# Patient Record
Sex: Female | Born: 1950
Health system: Southern US, Community
[De-identification: ages and names within clinical notes are randomized; demographics above are authoritative.]

## PROBLEM LIST (undated history)

## (undated) DIAGNOSIS — M1990 Unspecified osteoarthritis, unspecified site: Secondary | ICD-10-CM

## (undated) DIAGNOSIS — IMO0001 Reserved for inherently not codable concepts without codable children: Secondary | ICD-10-CM

## (undated) DIAGNOSIS — H269 Unspecified cataract: Secondary | ICD-10-CM

## (undated) DIAGNOSIS — E785 Hyperlipidemia, unspecified: Secondary | ICD-10-CM

## (undated) HISTORY — DX: Reserved for inherently not codable concepts without codable children: IMO0001

## (undated) HISTORY — DX: Unspecified osteoarthritis, unspecified site: M19.90

## (undated) HISTORY — DX: Unspecified cataract: H26.9

## (undated) HISTORY — DX: Hyperlipidemia, unspecified: E78.5

---

## 2001-09-16 ENCOUNTER — Ambulatory Visit (HOSPITAL_COMMUNITY): Admission: RE | Admit: 2001-09-16 | Discharge: 2001-09-16 | Payer: Self-pay | Admitting: Family Medicine

## 2001-09-16 ENCOUNTER — Encounter: Payer: Self-pay | Admitting: Family Medicine

## 2007-10-04 ENCOUNTER — Ambulatory Visit (HOSPITAL_COMMUNITY): Admission: RE | Admit: 2007-10-04 | Discharge: 2007-10-04 | Payer: Self-pay | Admitting: Family Medicine

## 2010-03-10 ENCOUNTER — Ambulatory Visit (HOSPITAL_COMMUNITY): Admission: RE | Admit: 2010-03-10 | Discharge: 2010-03-10 | Payer: Self-pay | Admitting: Preventative Medicine

## 2010-06-27 ENCOUNTER — Ambulatory Visit (HOSPITAL_COMMUNITY): Admission: RE | Admit: 2010-06-27 | Discharge: 2010-06-27 | Payer: Self-pay | Admitting: Family Medicine

## 2010-07-12 ENCOUNTER — Encounter: Payer: Self-pay | Admitting: Gastroenterology

## 2010-07-19 ENCOUNTER — Ambulatory Visit: Payer: Self-pay | Admitting: Gastroenterology

## 2010-07-19 ENCOUNTER — Ambulatory Visit (HOSPITAL_COMMUNITY): Admission: RE | Admit: 2010-07-19 | Discharge: 2010-07-19 | Payer: Self-pay | Admitting: Gastroenterology

## 2010-11-08 NOTE — Letter (Signed)
Summary: Internal Other Domingo Dimes  Internal Other Domingo Dimes   Imported By: Cloria Spring LPN 04/54/0981 19:14:78  _____________________________________________________________________  External Attachment:    Type:   Image     Comment:   External Document

## 2011-06-19 ENCOUNTER — Other Ambulatory Visit: Payer: Self-pay | Admitting: Family Medicine

## 2011-06-19 DIAGNOSIS — Z139 Encounter for screening, unspecified: Secondary | ICD-10-CM

## 2011-06-22 ENCOUNTER — Ambulatory Visit (HOSPITAL_COMMUNITY)
Admission: RE | Admit: 2011-06-22 | Discharge: 2011-06-22 | Disposition: A | Discharge status: Home / Self Care | Payer: BC Managed Care – PPO | Source: Ambulatory Visit | Attending: Family Medicine | Admitting: Family Medicine

## 2011-06-22 DIAGNOSIS — Z139 Encounter for screening, unspecified: Secondary | ICD-10-CM

## 2011-06-22 DIAGNOSIS — Z1231 Encounter for screening mammogram for malignant neoplasm of breast: Secondary | ICD-10-CM | POA: Insufficient documentation

## 2012-07-03 ENCOUNTER — Other Ambulatory Visit: Payer: Self-pay | Admitting: Family Medicine

## 2012-07-03 DIAGNOSIS — Z139 Encounter for screening, unspecified: Secondary | ICD-10-CM

## 2012-07-05 ENCOUNTER — Ambulatory Visit (HOSPITAL_COMMUNITY)
Admission: RE | Admit: 2012-07-05 | Discharge: 2012-07-05 | Disposition: A | Discharge status: Home / Self Care | Payer: BC Managed Care – PPO | Source: Ambulatory Visit | Attending: Family Medicine | Admitting: Family Medicine

## 2012-07-05 DIAGNOSIS — Z139 Encounter for screening, unspecified: Secondary | ICD-10-CM

## 2012-07-05 DIAGNOSIS — Z1231 Encounter for screening mammogram for malignant neoplasm of breast: Secondary | ICD-10-CM | POA: Insufficient documentation

## 2013-07-17 ENCOUNTER — Other Ambulatory Visit: Payer: Self-pay | Admitting: Family Medicine

## 2013-07-17 DIAGNOSIS — Z139 Encounter for screening, unspecified: Secondary | ICD-10-CM

## 2013-07-21 ENCOUNTER — Ambulatory Visit (HOSPITAL_COMMUNITY)
Admission: RE | Admit: 2013-07-21 | Discharge: 2013-07-21 | Disposition: A | Discharge status: Home / Self Care | Payer: BC Managed Care – PPO | Source: Ambulatory Visit | Attending: Family Medicine | Admitting: Family Medicine

## 2013-07-21 DIAGNOSIS — Z1231 Encounter for screening mammogram for malignant neoplasm of breast: Secondary | ICD-10-CM | POA: Insufficient documentation

## 2013-07-21 DIAGNOSIS — Z139 Encounter for screening, unspecified: Secondary | ICD-10-CM

## 2013-07-24 ENCOUNTER — Ambulatory Visit (INDEPENDENT_AMBULATORY_CARE_PROVIDER_SITE_OTHER): Payer: BC Managed Care – PPO | Admitting: Family Medicine

## 2013-07-24 ENCOUNTER — Encounter: Payer: Self-pay | Admitting: Family Medicine

## 2013-07-24 VITALS — BP 122/86 | Ht <= 58 in | Wt 102.0 lb

## 2013-07-24 DIAGNOSIS — E785 Hyperlipidemia, unspecified: Secondary | ICD-10-CM

## 2013-07-24 DIAGNOSIS — Z0000 Encounter for general adult medical examination without abnormal findings: Secondary | ICD-10-CM

## 2013-07-24 DIAGNOSIS — M549 Dorsalgia, unspecified: Secondary | ICD-10-CM

## 2013-07-24 DIAGNOSIS — E782 Mixed hyperlipidemia: Secondary | ICD-10-CM | POA: Insufficient documentation

## 2013-07-24 DIAGNOSIS — Z23 Encounter for immunization: Secondary | ICD-10-CM

## 2013-07-24 DIAGNOSIS — M2550 Pain in unspecified joint: Secondary | ICD-10-CM

## 2013-07-24 LAB — LIPID PANEL
Cholesterol: 233 mg/dL — ABNORMAL HIGH (ref 0–200)
Total CHOL/HDL Ratio: 3.6 Ratio

## 2013-07-24 LAB — BASIC METABOLIC PANEL
BUN: 12 mg/dL (ref 6–23)
CO2: 26 mEq/L (ref 19–32)
Calcium: 9.7 mg/dL (ref 8.4–10.5)
Creat: 0.71 mg/dL (ref 0.50–1.10)
Glucose, Bld: 88 mg/dL (ref 70–99)
Sodium: 137 mEq/L (ref 135–145)

## 2013-07-24 LAB — HEPATIC FUNCTION PANEL
ALT: 16 U/L (ref 0–35)
AST: 18 U/L (ref 0–37)
Alkaline Phosphatase: 96 U/L (ref 39–117)
Bilirubin, Direct: 0.1 mg/dL (ref 0.0–0.3)
Total Protein: 7.2 g/dL (ref 6.0–8.3)

## 2013-07-24 LAB — SEDIMENTATION RATE: Sed Rate: 14 mm/hr (ref 0–22)

## 2013-07-24 LAB — RHEUMATOID FACTOR: Rhuematoid fact SerPl-aCnc: <10 IU/mL (ref <=14)

## 2013-07-24 NOTE — Progress Notes (Signed)
  Subjective:    Patient ID: Andrea Mays, female    DOB: 08-23-1951, 62 y.o.   MRN: [id number]  HPIHere for allergies. Runny nose, sinus congestion.   Requesting Flu vaccine.   Review of Systems  Constitutional: Negative for activity change, appetite change and fatigue.  HENT: Negative for congestion, ear discharge and rhinorrhea.   Eyes: Negative for discharge.  Respiratory: Negative for cough, chest tightness and wheezing.   Cardiovascular: Negative for chest pain.  Gastrointestinal: Negative for vomiting and abdominal pain.  Genitourinary: Negative for frequency and difficulty urinating.  Musculoskeletal: Negative for neck pain.  Allergic/Immunologic: Negative for environmental allergies and food allergies.  Neurological: Negative for weakness and headaches.  Psychiatric/Behavioral: Negative for behavioral problems and agitation.   She denies chest tightness pressure pain shortness breath rectal bleeding.    Objective:   Physical Exam  Constitutional: She is oriented to person, place, and time. She appears well-developed and well-nourished.  HENT:  Head: Normocephalic.  Right Ear: External ear normal.  Left Ear: External ear normal.  Eyes: Pupils are equal, round, and reactive to light.  Neck: Normal range of motion. No thyromegaly present.  Cardiovascular: Normal rate, regular rhythm, normal heart sounds and intact distal pulses.   No murmur heard. Pulmonary/Chest: Effort normal and breath sounds normal. No respiratory distress. She has no wheezes.  Abdominal: Soft. Bowel sounds are normal. She exhibits no distension and no mass. There is no tenderness.  Genitourinary:  Patient defers on breast exam in genitourinary exam  Musculoskeletal: Normal range of motion. She exhibits no edema and no tenderness.  Lymphadenopathy:    She has no cervical adenopathy.  Neurological: She is alert and oriented to person, place, and time. She exhibits normal muscle tone.  Skin: Skin is  warm and dry.  Psychiatric: She has a normal mood and affect. Her behavior is normal.   Lungs are clear heart is regular pulses normal abdomen soft no guarding rebound or tenderness. She does have arthritis in her hands. Extremities no edema. Neurologic grossly normal. Ears throat normal.       Assessment & Plan:  Wellness-labs ordered Flu shot given Arthritis labs tested. Await results Up-to-date on colonoscopy. Followup 1 year. Dietary measures exercise all discussed.

## 2013-07-25 ENCOUNTER — Ambulatory Visit (HOSPITAL_COMMUNITY)
Admission: RE | Admit: 2013-07-25 | Discharge: 2013-07-25 | Disposition: A | Discharge status: Home / Self Care | Payer: BC Managed Care – PPO | Source: Ambulatory Visit | Attending: Family Medicine | Admitting: Family Medicine

## 2013-07-25 ENCOUNTER — Encounter: Payer: Self-pay | Admitting: Family Medicine

## 2013-07-25 DIAGNOSIS — R937 Abnormal findings on diagnostic imaging of other parts of musculoskeletal system: Secondary | ICD-10-CM | POA: Insufficient documentation

## 2013-07-25 DIAGNOSIS — M549 Dorsalgia, unspecified: Secondary | ICD-10-CM

## 2013-07-25 DIAGNOSIS — M545 Low back pain, unspecified: Secondary | ICD-10-CM | POA: Insufficient documentation

## 2014-07-17 ENCOUNTER — Other Ambulatory Visit: Payer: Self-pay | Admitting: Family Medicine

## 2014-07-17 DIAGNOSIS — Z1231 Encounter for screening mammogram for malignant neoplasm of breast: Secondary | ICD-10-CM

## 2014-07-24 ENCOUNTER — Ambulatory Visit (HOSPITAL_COMMUNITY)
Admission: RE | Admit: 2014-07-24 | Discharge: 2014-07-24 | Disposition: A | Discharge status: Home / Self Care | Payer: BC Managed Care – PPO | Source: Ambulatory Visit | Attending: Family Medicine | Admitting: Family Medicine

## 2014-07-24 DIAGNOSIS — Z1231 Encounter for screening mammogram for malignant neoplasm of breast: Secondary | ICD-10-CM | POA: Insufficient documentation

## 2014-07-29 ENCOUNTER — Encounter: Payer: Self-pay | Admitting: Family Medicine

## 2014-07-29 ENCOUNTER — Ambulatory Visit (INDEPENDENT_AMBULATORY_CARE_PROVIDER_SITE_OTHER): Payer: BC Managed Care – PPO | Admitting: Family Medicine

## 2014-07-29 ENCOUNTER — Ambulatory Visit (HOSPITAL_COMMUNITY)
Admission: RE | Admit: 2014-07-29 | Discharge: 2014-07-29 | Disposition: A | Discharge status: Home / Self Care | Payer: BC Managed Care – PPO | Source: Ambulatory Visit | Attending: Family Medicine | Admitting: Family Medicine

## 2014-07-29 VITALS — BP 120/82 | Ht <= 58 in | Wt 103.0 lb

## 2014-07-29 DIAGNOSIS — M79641 Pain in right hand: Secondary | ICD-10-CM | POA: Diagnosis present

## 2014-07-29 DIAGNOSIS — M25549 Pain in joints of unspecified hand: Secondary | ICD-10-CM

## 2014-07-29 DIAGNOSIS — M1612 Unilateral primary osteoarthritis, left hip: Secondary | ICD-10-CM | POA: Insufficient documentation

## 2014-07-29 DIAGNOSIS — M19041 Primary osteoarthritis, right hand: Secondary | ICD-10-CM | POA: Diagnosis not present

## 2014-07-29 DIAGNOSIS — E785 Hyperlipidemia, unspecified: Secondary | ICD-10-CM

## 2014-07-29 DIAGNOSIS — M1990 Unspecified osteoarthritis, unspecified site: Secondary | ICD-10-CM

## 2014-07-29 DIAGNOSIS — M79643 Pain in unspecified hand: Secondary | ICD-10-CM

## 2014-07-29 DIAGNOSIS — Z23 Encounter for immunization: Secondary | ICD-10-CM

## 2014-07-29 NOTE — Progress Notes (Signed)
   Subjective:    Patient ID: Andrea Mays, female    DOB: [birth year], 63 y.o.   MRN: [id number]  HPI Patient is here today for a check up.  Patient states her pain keeps getting worst with her arthritis. She takes IBU for it.   Wants flu shot Patient feels she is doing pretty good overall but she states the left hip is giving her greater trouble  Review of Systems Complains of severe left hip pain also complains of hand stiffness denies shortness of breath chest    Objective:   Physical Exam  Lungs are clear heart regular extremities no edema skin warm dry and severe limitation of range of motion of the hip significant arthritis of both hands      Assessment & Plan:  Significant arthritis in the hands most likely osteoarthritis I recommend x-ray if this shows bone destruction she will need to see a rheumatologist. Lab work previously looked good.  Severe arthritis left hip progressive we'll set her up with orthopedics. May need hip replacement  History of elevated LDL repeat lab work HDL has looked good  Patient stays up-to-date on mammogram and

## 2014-07-30 LAB — LIPID PANEL
Cholesterol: 222 mg/dL — ABNORMAL HIGH (ref 0–200)
HDL: 59 mg/dL (ref >39)
LDL Cholesterol: 124 mg/dL — ABNORMAL HIGH (ref 0–99)
Total CHOL/HDL Ratio: 3.8 Ratio
Triglycerides: 195 mg/dL — ABNORMAL HIGH (ref <150)
VLDL: 39 mg/dL (ref 0–40)

## 2014-07-30 LAB — BASIC METABOLIC PANEL WITH GFR
BUN: 12 mg/dL (ref 6–23)
CO2: 24 meq/L (ref 19–32)
Calcium: 9 mg/dL (ref 8.4–10.5)
Chloride: 103 meq/L (ref 96–112)
Creat: 0.59 mg/dL (ref 0.50–1.10)
Glucose, Bld: 75 mg/dL (ref 70–99)
Potassium: 4.5 meq/L (ref 3.5–5.3)
Sodium: 137 meq/L (ref 135–145)

## 2014-07-31 ENCOUNTER — Encounter: Payer: Self-pay | Admitting: Family Medicine

## 2014-07-31 LAB — SEDIMENTATION RATE: Sed Rate: 8 mm/hr (ref 0–22)

## 2014-08-18 ENCOUNTER — Ambulatory Visit (INDEPENDENT_AMBULATORY_CARE_PROVIDER_SITE_OTHER): Payer: Medicare HMO

## 2014-08-18 ENCOUNTER — Ambulatory Visit (INDEPENDENT_AMBULATORY_CARE_PROVIDER_SITE_OTHER): Payer: Medicare HMO | Admitting: Orthopedic Surgery

## 2014-08-18 ENCOUNTER — Encounter: Payer: Self-pay | Admitting: Orthopedic Surgery

## 2014-08-18 VITALS — BP 132/83 | Ht <= 58 in | Wt 103.0 lb

## 2014-08-18 DIAGNOSIS — M25552 Pain in left hip: Secondary | ICD-10-CM

## 2014-08-18 DIAGNOSIS — M1612 Unilateral primary osteoarthritis, left hip: Secondary | ICD-10-CM

## 2014-08-18 DIAGNOSIS — M1990 Unspecified osteoarthritis, unspecified site: Secondary | ICD-10-CM

## 2014-08-18 NOTE — Progress Notes (Signed)
Patient ID: Andrea Mays, female   DOB: 03-Mar-1951, 63 y.o.   MRN: [id number]  Chief Complaint  Patient presents with  . Hip Pain    left hip/groin pain, down left leg, no known injury, referral [name]    63 year old [place] female who presents with a 4 year history of progressively increasing left hip pain radiating down her leg and thigh with some pain into the left lower leg but denies any back pain. She does have some buttock pain. She has noticed this more difficult for her to climb stairs and for her to walk. She's taken Advil for pain with some relief.  No past medical history on file. No past surgical history on file. History  Substance Use Topics  . Smoking status: Never Smoker   . Smokeless tobacco: Not on file  . Alcohol Use: Not on file   No family history on file.  Review of Systems  HENT: Positive for congestion.   Eyes: Positive for redness.  Musculoskeletal: Positive for joint pain.  All other systems reviewed and are negative.  Vitals are stable her appearance is normal body frame is small she has no developmental abnormalities. She is oriented 3 her mood and affect are pleasant and happy. She ambulates with a limp favoring the left leg. Her lumbar spine is nontender her left gluteal area is nontender left iliac crest I did get some pain response. She has decreased internal rotation of the left hip normal painless flexion she does have some pain with attempts at internal rotation. Hip is stable. Lower extremities muscle tone is normal skin is intact good distal pulses and normal sensation. Her upper extremities are normal. Her right hip shows normal range of motion stability and strength no tenderness over the greater trochanter  X-rays show osteoarthritis of the left hip severe  X-rays also show mild degenerative disc disease lumbar spine  An independent interpretation of the hip x-ray is severe arthritis of the hip  We talked about medication versus surgery and  they chose surgery. We also advised the mother risks of surgery including but not limited to bleeding infection dislocation pulmonary embolus and blood clot  They will be back from [place] in February and we will have them scheduled for an appointment here to preop her for surgery.

## 2014-08-18 NOTE — Patient Instructions (Signed)
Total Hip Replacement Total hip replacement is the replacement of your damaged hip with an artificial hip joint (prosthetic hip joint). The purpose of this surgery is to reduce pain and improve your hip function. LET [facility] CARE PROVIDER KNOW ABOUT:   Any allergies you have.  All medicines you are taking, including vitamins, herbs, eye drops, creams, and over-the-counter medicines.  Previous problems you or members of your family have had with the use of anesthetics.  Any blood disorders you have.  Previous surgeries you have had.  Medical conditions you have. RISKS AND COMPLICATIONS Generally, total hip replacement is a safe procedure. However, problems can occur and include:  Infection.  Dislocation (the ball of the hip-joint prosthesis comes out of contact with the socket).  Loosening of the stem connected to the ball or socket.  Fracture of the bone while inserting the prosthesis.  Formation of blood clots, which can break loose and travel to and injure your lungs (pulmonary embolus). BEFORE THE PROCEDURE   Do not eat or drink anything after midnight on the night before the procedure or as directed by your health care provider.  Ask your health care provider about changing or stopping your regular medicines. This is especially important if you are taking diabetes medicines or blood thinners. PROCEDURE  Just before the procedure, you will receive medicine that makes you drowsy (sedative) or a medicine that makes you fall asleep (general anesthetic). This will be given through a tube that is inserted into one of your veins (IV tube).  You will then receive a medicine injected into your spine that numbs your body below the waist (spinal anesthetic).  An incision is made in your hip. Your surgeon will take out any damaged cartilage and bone.  Next, your surgeon will insert a prosthetic socket into your pelvic bone. This is usually secured with screws.  Your surgeon will  then cut off the ball of your thigh bone (femur) and attach a prosthetic ball on a stem to your femur.  The surgeon will place the ball into the socket and check the range of motion of your new hip. AFTER THE PROCEDURE   You will be taken to a recovery area where a nurse will watch and check your progress.  Once you are awake and stable, you will be taken to a hospital room.  You will receive physical therapy until you are doing well and your health care provider feels it is safe for you to go home. Document Released: 01/01/2001 Document Revised: 02/09/2014 Document Reviewed: 11/26/2013 [facility] Patient Information 2015 [place], [place]. This information is not intended to replace advice given to you by your health care provider. Make sure you discuss any questions you have with your health care provider. Total Hip Replacement Total hip replacement is the replacement of your damaged hip with an artificial hip joint (prosthetic hip joint). The purpose of this surgery is to reduce pain and improve your hip function. LET [facility] CARE PROVIDER KNOW ABOUT:   Any allergies you have.  All medicines you are taking, including vitamins, herbs, eye drops, creams, and over-the-counter medicines.  Previous problems you or members of your family have had with the use of anesthetics.  Any blood disorders you have.  Previous surgeries you have had.  Medical conditions you have. RISKS AND COMPLICATIONS Generally, total hip replacement is a safe procedure. However, problems can occur and include:  Infection.  Dislocation (the ball of the hip-joint prosthesis comes out of contact with  the socket).  Loosening of the stem connected to the ball or socket.  Fracture of the bone while inserting the prosthesis.  Formation of blood clots, which can break loose and travel to and injure your lungs (pulmonary embolus). BEFORE THE PROCEDURE   Do not eat or drink anything after midnight on the night  before the procedure or as directed by your health care provider.  Ask your health care provider about changing or stopping your regular medicines. This is especially important if you are taking diabetes medicines or blood thinners. PROCEDURE  Just before the procedure, you will receive medicine that makes you drowsy (sedative) or a medicine that makes you fall asleep (general anesthetic). This will be given through a tube that is inserted into one of your veins (IV tube).  You will then receive a medicine injected into your spine that numbs your body below the waist (spinal anesthetic).  An incision is made in your hip. Your surgeon will take out any damaged cartilage and bone.  Next, your surgeon will insert a prosthetic socket into your pelvic bone. This is usually secured with screws.  Your surgeon will then cut off the ball of your thigh bone (femur) and attach a prosthetic ball on a stem to your femur.  The surgeon will place the ball into the socket and check the range of motion of your new hip. AFTER THE PROCEDURE   You will be taken to a recovery area where a nurse will watch and check your progress.  Once you are awake and stable, you will be taken to a hospital room.  You will receive physical therapy until you are doing well and your health care provider feels it is safe for you to go home. Document Released: 01/01/2001 Document Revised: 02/09/2014 Document Reviewed: 11/26/2013 [facility] Patient Information 2015 [place], [place]. This information is not intended to replace advice given to you by your health care provider. Make sure you discuss any questions you have with your health care provider.

## 2014-08-19 ENCOUNTER — Encounter: Payer: Self-pay | Admitting: Orthopedic Surgery

## 2014-11-10 ENCOUNTER — Encounter: Payer: Self-pay | Admitting: Orthopedic Surgery

## 2014-11-10 ENCOUNTER — Ambulatory Visit (INDEPENDENT_AMBULATORY_CARE_PROVIDER_SITE_OTHER): Payer: Medicare Other | Admitting: Orthopedic Surgery

## 2014-11-10 ENCOUNTER — Other Ambulatory Visit: Payer: Self-pay | Admitting: *Deleted

## 2014-11-10 VITALS — BP 138/87 | Ht <= 58 in | Wt 108.0 lb

## 2014-11-10 DIAGNOSIS — M1990 Unspecified osteoarthritis, unspecified site: Secondary | ICD-10-CM | POA: Diagnosis not present

## 2014-11-10 DIAGNOSIS — M1612 Unilateral primary osteoarthritis, left hip: Secondary | ICD-10-CM

## 2014-11-10 NOTE — Progress Notes (Signed)
Patient ID: Andrea Mays, female   DOB: 07-16-1951, 64 y.o.   MRN: [id number]  Chief Complaint  Patient presents with  . Follow-up    follow up left hip, discuss surgery. ref. S. Luking    HPI Andrea Mays is a 64 y.o. female.  The patient presents back for reevaluation of her left hip for total hip replacement HPI Primary symptoms include groin and thigh pain and difficulty ambulating Review of Systems Review of Systems Currently no current problems under review of systems patient has been healthy recently [place] from [place].  Past Medical History  Diagnosis Date  . No abnormality seen     No past surgical history on file.  No family history on file.  Social History History  Substance Use Topics  . Smoking status: Never Smoker   . Smokeless tobacco: Not on file  . Alcohol Use: No    No Known Allergies  Current Outpatient Prescriptions  Medication Sig Dispense Refill  . aspirin 81 MG tablet Take 81 mg by mouth daily.    . Cholecalciferol (VITAMIN D-3 PO) Take by mouth.    . Fish Oil-Cholecalciferol (FISH OIL + D3 PO) Take by mouth.    . Multiple Vitamin (MULTIVITAMIN) tablet Take 1 tablet by mouth daily.     No current facility-administered medications for this visit.       Physical Exam Blood pressure 138/87, height 4\' 8"  (1.422 m), weight 108 lb (48.988 kg). Physical Exam BP 138/87 mmHg  Ht 4\' 8"  (1.422 m)  Wt 108 lb (48.988 kg)  BMI 24.23 kg/m2 Very small thin well-developed well-nourished female grooming hygiene intact oriented 3 mood pleasant gait slight pain noted in the left hip.  Her upper extremities normal  Right hip and right leg normal.  Left leg shows normal hip flexion decreased internal rotation no instability normal muscle tone in both legs skin normal both lower legs pulses good distally sensation normal. Groin nodes negative. Upper extremities normal.  Data Reviewed X-ray of her hip from the previous visit show  osteoarthritis does not appear to have congenital deformity  Assessment    Encounter Diagnosis  Name Primary?  Marland Kitchen Arthritis of left hip Yes        Plan    Recommend left total hip arthroplasty

## 2014-11-10 NOTE — Patient Instructions (Addendum)
Left total hip replacement 11/25/14    You have decided to proceed with hip replacement surgery. You have decided not to continue with nonoperative measures such as but not limited to oral medication, weight loss, activity modification, physical therapy, bracing, or injection.  We will perform the procedure commonly known as total hip replacement. Some of the risks associated with hip replacement include but are not limited to Bleeding Infection Swelling Stiffness Blood clot Pain Dislocation  Infection is a devastating complication of joint replacement surgery. If you get an infection or implant usually will have to be removed and several surgeries and antibiotics will be needed to eradicate the infection. In some rare cases the hip cannot be reconstructed with another implant. This will lead to permanent need for crutches and assistive devices to ambulate.   If you're not comfortable with these risks and would like to continue with nonoperative treatment please let Dr. Aline Brochure know prior to your surgery.

## 2014-11-11 ENCOUNTER — Other Ambulatory Visit: Payer: Self-pay | Admitting: *Deleted

## 2014-11-19 ENCOUNTER — Telehealth: Payer: Self-pay | Admitting: Orthopedic Surgery

## 2014-11-19 NOTE — Telephone Encounter (Signed)
[employer] representative has question, length of need for Vena Pro brace; patient is scheduled for total hip surgeryon 11/25/14.  His direct [phone]

## 2014-11-20 ENCOUNTER — Other Ambulatory Visit: Payer: Self-pay

## 2014-11-20 ENCOUNTER — Encounter (HOSPITAL_COMMUNITY)
Admission: RE | Admit: 2014-11-20 | Discharge: 2014-11-20 | Disposition: A | Discharge status: Home / Self Care | Payer: Medicare Other | Source: Ambulatory Visit | Attending: Orthopedic Surgery | Admitting: Orthopedic Surgery

## 2014-11-20 ENCOUNTER — Encounter (HOSPITAL_COMMUNITY): Payer: Self-pay

## 2014-11-20 DIAGNOSIS — Z01818 Encounter for other preprocedural examination: Secondary | ICD-10-CM | POA: Diagnosis not present

## 2014-11-20 DIAGNOSIS — M1612 Unilateral primary osteoarthritis, left hip: Secondary | ICD-10-CM | POA: Insufficient documentation

## 2014-11-20 LAB — ABO/RH: ABO/RH(D): B POS

## 2014-11-20 LAB — SURGICAL PCR SCREEN
MRSA, PCR: NEGATIVE
Staphylococcus aureus: NEGATIVE

## 2014-11-20 LAB — PREPARE RBC (CROSSMATCH)

## 2014-11-20 NOTE — Patient Instructions (Signed)
Andrea Mays  11/20/2014   Your procedure is scheduled on:   11/25/2014  Report to [facility] at  54  AM.  Call this number if you have problems the morning of surgery: [phone]   Remember:   Do not eat food or drink liquids after midnight.   Take these medicines the morning of surgery with A SIP OF WATER: none   Do not wear jewelry, make-up or nail polish.  Do not wear lotions, powders, or perfumes.   Do not shave 48 hours prior to surgery. Men may shave face and neck.  Do not bring valuables to the hospital.  [facility] is not responsible for any belongings or valuables.               Contacts, dentures or bridgework may not be worn into surgery.  Leave suitcase in the car. After surgery it may be brought to your room.  For patients admitted to the hospital, discharge time is determined by your treatment team.               Patients discharged the day of surgery will not be allowed to drive home.  Name and phone number of your driver: family  Special Instructions: Shower using CHG 2 nights before surgery and the night before surgery.  If you shower the day of surgery use CHG.  Use special wash - you have one bottle of CHG for all showers.  You should use approximately 1/3 of the bottle for each shower.   Please read over the following fact sheets that you were given: Pain Booklet, Coughing and Deep Breathing, Blood Transfusion Information, Lab Information, Total Joint Packet, MRSA Information, Surgical Site Infection Prevention, Anesthesia Post-op Instructions and Care and Recovery After Surgery Total Hip Replacement Total hip replacement is the replacement of your damaged hip with an artificial hip joint (prosthetic hip joint). The purpose of this surgery is to reduce pain and improve your hip function. LET [facility] CARE PROVIDER KNOW ABOUT:   Any allergies you have.  All medicines you are taking, including vitamins, herbs, eye drops, creams, and over-the-counter  medicines.  Previous problems you or members of your family have had with the use of anesthetics.  Any blood disorders you have.  Previous surgeries you have had.  Medical conditions you have. RISKS AND COMPLICATIONS Generally, total hip replacement is a safe procedure. However, problems can occur and include:  Infection.  Dislocation (the ball of the hip-joint prosthesis comes out of contact with the socket).  Loosening of the stem connected to the ball or socket.  Fracture of the bone while inserting the prosthesis.  Formation of blood clots, which can break loose and travel to and injure your lungs (pulmonary embolus). BEFORE THE PROCEDURE   Do not eat or drink anything after midnight on the night before the procedure or as directed by your health care provider.  Ask your health care provider about changing or stopping your regular medicines. This is especially important if you are taking diabetes medicines or blood thinners. PROCEDURE  Just before the procedure, you will receive medicine that makes you drowsy (sedative) or a medicine that makes you fall asleep (general anesthetic). This will be given through a tube that is inserted into one of your veins (IV tube).  You will then receive a medicine injected into your spine that numbs your body below the waist (spinal anesthetic).  An incision is made in your hip. Your  surgeon will take out any damaged cartilage and bone.  Next, your surgeon will insert a prosthetic socket into your pelvic bone. This is usually secured with screws.  Your surgeon will then cut off the ball of your thigh bone (femur) and attach a prosthetic ball on a stem to your femur.  The surgeon will place the ball into the socket and check the range of motion of your new hip. AFTER THE PROCEDURE   You will be taken to a recovery area where a nurse will watch and check your progress.  Once you are awake and stable, you will be taken to a hospital  room.  You will receive physical therapy until you are doing well and your health care provider feels it is safe for you to go home. Document Released: 01/01/2001 Document Revised: 02/09/2014 Document Reviewed: 11/26/2013 [facility] Patient Information 2015 [place], [place]. This information is not intended to replace advice given to you by your health care provider. Make sure you discuss any questions you have with your health care provider. PATIENT INSTRUCTIONS POST-ANESTHESIA  IMMEDIATELY FOLLOWING SURGERY:  Do not drive or operate machinery for the first twenty four hours after surgery.  Do not make any important decisions for twenty four hours after surgery or while taking narcotic pain medications or sedatives.  If you develop intractable nausea and vomiting or a severe headache please notify your doctor immediately.  FOLLOW-UP:  Please make an appointment with your surgeon as instructed. You do not need to follow up with anesthesia unless specifically instructed to do so.  WOUND CARE INSTRUCTIONS (if applicable):  Keep a dry clean dressing on the anesthesia/puncture wound site if there is drainage.  Once the wound has quit draining you may leave it open to air.  Generally you should leave the bandage intact for twenty four hours unless there is drainage.  If the epidural site drains for more than 36-48 hours please call the anesthesia department.  QUESTIONS?:  Please feel free to call your physician or the hospital operator if you have any questions, and they will be happy to assist you.

## 2014-11-20 NOTE — Progress Notes (Signed)
Aspirin stopped on 11/19/2014 per patient and patient's husband.

## 2014-11-20 NOTE — Telephone Encounter (Signed)
Routing to Dr Harrison 

## 2014-11-24 MED ORDER — TRANEXAMIC ACID 100 MG/ML IV SOLN
1000.0000 mg | INTRAVENOUS | Status: AC
  Administered 2014-11-25: 1000 mg via INTRAVENOUS
  Filled 2014-11-24: qty 10

## 2014-11-24 NOTE — Telephone Encounter (Signed)
Do you mean venopro dvt prevention device???  If yes 2 weeks

## 2014-11-24 NOTE — Telephone Encounter (Signed)
Left vm with rep

## 2014-11-24 NOTE — H&P (Signed)
Patient ID: Andrea Mays, female   DOB: [birth year], 64 y.o.   MRN: [id number]    Chief Complaint   Patient presents with   .  Hip Pain       left hip/groin pain, down left leg, no known injury, referral [name]     7 year old Andrea Mays female who presents with a 4 year history of progressively increasing left hip pain radiating down her leg and thigh with some pain into the left lower leg but denies any back pain. She does have some buttock pain. She has noticed this more difficult for her to climb stairs and for her to walk. She's taken Advil for pain with some relief.  No past medical history on file. No past surgical history on file. History   Substance Use Topics   .  Smoking status:  Never Smoker    .  Smokeless tobacco:  Not on file   .  Alcohol Use:  Not on file    No family history on file.  Review of Systems  HENT: Positive for congestion.   Eyes: Positive for redness.  Musculoskeletal: Positive for joint pain.  All other systems reviewed and are negative.  Vitals are stable her appearance is normal body frame is small she has no developmental abnormalities. She is oriented 3 her mood and affect are pleasant and happy. She ambulates with a limp favoring the left leg. Her lumbar spine is nontender her left gluteal area is nontender left iliac crest I did get some pain response. She has decreased internal rotation of the left hip normal painless flexion she does have some pain with attempts at internal rotation. Hip is stable. Lower extremities muscle tone is normal skin is intact good distal pulses and normal sensation. Her upper extremities are normal. Her right hip shows normal range of motion stability and strength no tenderness over the greater trochanter  X-rays show osteoarthritis of the left hip severe  X-rays also show mild degenerative disc disease lumbar spine  An independent interpretation of the hip x-ray is severe arthritis of the left hip  The patient will be  admitted for left total hip arthroplasty

## 2014-11-25 ENCOUNTER — Inpatient Hospital Stay (HOSPITAL_COMMUNITY): Payer: Medicare Other | Admitting: Anesthesiology

## 2014-11-25 ENCOUNTER — Encounter (HOSPITAL_COMMUNITY)
Admission: RE | Disposition: A | Discharge status: Home Health | Payer: Self-pay | Source: Ambulatory Visit | Attending: Orthopedic Surgery

## 2014-11-25 ENCOUNTER — Encounter (HOSPITAL_COMMUNITY): Payer: Self-pay | Admitting: *Deleted

## 2014-11-25 ENCOUNTER — Inpatient Hospital Stay (HOSPITAL_COMMUNITY): Payer: Medicare Other

## 2014-11-25 ENCOUNTER — Telehealth: Payer: Self-pay | Admitting: Orthopedic Surgery

## 2014-11-25 ENCOUNTER — Inpatient Hospital Stay (HOSPITAL_COMMUNITY)
Admission: RE | Admit: 2014-11-25 | Discharge: 2014-11-27 | DRG: 470 | Disposition: A | Discharge status: Home Health | Payer: Medicare Other | Source: Ambulatory Visit | Attending: Orthopedic Surgery | Admitting: Orthopedic Surgery

## 2014-11-25 DIAGNOSIS — M25552 Pain in left hip: Secondary | ICD-10-CM | POA: Diagnosis not present

## 2014-11-25 DIAGNOSIS — Z09 Encounter for follow-up examination after completed treatment for conditions other than malignant neoplasm: Secondary | ICD-10-CM

## 2014-11-25 DIAGNOSIS — M1612 Unilateral primary osteoarthritis, left hip: Secondary | ICD-10-CM | POA: Diagnosis present

## 2014-11-25 DIAGNOSIS — Z96642 Presence of left artificial hip joint: Secondary | ICD-10-CM | POA: Diagnosis not present

## 2014-11-25 DIAGNOSIS — Z471 Aftercare following joint replacement surgery: Secondary | ICD-10-CM | POA: Diagnosis not present

## 2014-11-25 HISTORY — PX: TOTAL HIP ARTHROPLASTY: SHX124

## 2014-11-25 LAB — BASIC METABOLIC PANEL
ANION GAP: 7 (ref 5–15)
BUN: 12 mg/dL (ref 6–23)
CO2: 26 mmol/L (ref 19–32)
CREATININE: 0.6 mg/dL (ref 0.50–1.10)
Calcium: 9.2 mg/dL (ref 8.4–10.5)
Chloride: 106 mmol/L (ref 96–112)
GFR calc Af Amer: >90 mL/min (ref >90)
Glucose, Bld: 99 mg/dL (ref 70–99)
Potassium: 4 mmol/L (ref 3.5–5.1)
Sodium: 139 mmol/L (ref 135–145)

## 2014-11-25 LAB — CBC WITH DIFFERENTIAL/PLATELET
Basophils Absolute: 0 10*3/uL (ref 0.0–0.1)
Basophils Relative: 0 % (ref 0–1)
EOS PCT: 3 % (ref 0–5)
Eosinophils Absolute: 0.2 10*3/uL (ref 0.0–0.7)
HCT: 41.6 % (ref 36.0–46.0)
HEMOGLOBIN: 13.8 g/dL (ref 12.0–15.0)
LYMPHS PCT: 26 % (ref 12–46)
Lymphs Abs: 1.8 10*3/uL (ref 0.7–4.0)
MCH: 29.1 pg (ref 26.0–34.0)
MCHC: 33.2 g/dL (ref 30.0–36.0)
MCV: 87.6 fL (ref 78.0–100.0)
MONOS PCT: 9 % (ref 3–12)
Monocytes Absolute: 0.6 10*3/uL (ref 0.1–1.0)
NEUTROS PCT: 62 % (ref 43–77)
Neutro Abs: 4.2 10*3/uL (ref 1.7–7.7)
PLATELETS: 301 10*3/uL (ref 150–400)
RBC: 4.75 MIL/uL (ref 3.87–5.11)
RDW: 13.1 % (ref 11.5–15.5)
WBC: 6.7 10*3/uL (ref 4.0–10.5)

## 2014-11-25 LAB — PROTIME-INR
INR: 0.9 (ref 0.00–1.49)
PROTHROMBIN TIME: 12.3 s (ref 11.6–15.2)

## 2014-11-25 LAB — APTT: aPTT: 25 seconds (ref 24–37)

## 2014-11-25 SURGERY — ARTHROPLASTY, HIP, TOTAL,POSTERIOR APPROACH
Anesthesia: Spinal | Site: Hip | Laterality: Left

## 2014-11-25 MED ORDER — SODIUM CHLORIDE 0.9 % IJ SOLN
INTRAMUSCULAR | Status: AC
  Filled 2014-11-25: qty 40

## 2014-11-25 MED ORDER — PROPOFOL INFUSION 10 MG/ML OPTIME
INTRAVENOUS | Status: DC | PRN
  Administered 2014-11-25: 25 ug/kg/min via INTRAVENOUS

## 2014-11-25 MED ORDER — BUPIVACAINE-EPINEPHRINE (PF) 0.5% -1:200000 IJ SOLN
INTRAMUSCULAR | Status: DC | PRN
  Administered 2014-11-25: 30 mL via PERINEURAL

## 2014-11-25 MED ORDER — ACETAMINOPHEN 500 MG PO TABS
1000.0000 mg | ORAL_TABLET | Freq: Four times a day (QID) | ORAL | Status: AC
  Administered 2014-11-25 – 2014-11-26 (×4): 1000 mg via ORAL
  Filled 2014-11-25 (×4): qty 2

## 2014-11-25 MED ORDER — OMEGA-3-ACID ETHYL ESTERS 1 G PO CAPS
1.0000 g | ORAL_CAPSULE | Freq: Every day | ORAL | Status: DC
  Administered 2014-11-25 – 2014-11-27 (×3): 1 g via ORAL
  Filled 2014-11-25 (×3): qty 1

## 2014-11-25 MED ORDER — MAGNESIUM CITRATE PO SOLN: 1.0000 | Freq: Once | ORAL | Status: AC | PRN

## 2014-11-25 MED ORDER — METHOCARBAMOL 1000 MG/10ML IJ SOLN
500.0000 mg | Freq: Four times a day (QID) | INTRAVENOUS | Status: DC | PRN
  Filled 2014-11-25: qty 5

## 2014-11-25 MED ORDER — MORPHINE SULFATE 4 MG/ML IJ SOLN
4.0000 mg | INTRAMUSCULAR | Status: DC | PRN
  Administered 2014-11-25: 4 mg via INTRAVENOUS
  Filled 2014-11-25: qty 1

## 2014-11-25 MED ORDER — MIDAZOLAM HCL 5 MG/5ML IJ SOLN
INTRAMUSCULAR | Status: DC | PRN
  Administered 2014-11-25: 1 mg via INTRAVENOUS

## 2014-11-25 MED ORDER — BUPIVACAINE LIPOSOME 1.3 % IJ SUSP
20.0000 mL | Freq: Once | INTRAMUSCULAR | Status: DC
  Filled 2014-11-25: qty 20

## 2014-11-25 MED ORDER — ONDANSETRON HCL 4 MG PO TABS: 4.0000 mg | ORAL_TABLET | Freq: Four times a day (QID) | ORAL | Status: DC | PRN

## 2014-11-25 MED ORDER — FENTANYL CITRATE 0.05 MG/ML IJ SOLN: 25.0000 ug | INTRAMUSCULAR | Status: DC | PRN

## 2014-11-25 MED ORDER — LACTATED RINGERS IV SOLN
INTRAVENOUS | Status: DC
  Administered 2014-11-25 (×2): via INTRAVENOUS

## 2014-11-25 MED ORDER — CEFAZOLIN SODIUM-DEXTROSE 2-3 GM-% IV SOLR
2.0000 g | INTRAVENOUS | Status: AC
  Administered 2014-11-25: 2 g via INTRAVENOUS
  Filled 2014-11-25: qty 50

## 2014-11-25 MED ORDER — FENTANYL CITRATE 0.05 MG/ML IJ SOLN
25.0000 ug | INTRAMUSCULAR | Status: DC
  Administered 2014-11-25: 25 ug via INTRAVENOUS
  Filled 2014-11-25: qty 2

## 2014-11-25 MED ORDER — POLYETHYLENE GLYCOL 3350 17 G PO PACK: 17.0000 g | PACK | Freq: Every day | ORAL | Status: DC | PRN

## 2014-11-25 MED ORDER — FENTANYL CITRATE 0.05 MG/ML IJ SOLN
INTRAMUSCULAR | Status: DC | PRN
  Administered 2014-11-25: 20 ug via INTRATHECAL
  Administered 2014-11-25 (×2): 25 ug via INTRAVENOUS

## 2014-11-25 MED ORDER — ALUM & MAG HYDROXIDE-SIMETH 200-200-20 MG/5ML PO SUSP: 30.0000 mL | ORAL | Status: DC | PRN

## 2014-11-25 MED ORDER — SODIUM CHLORIDE 0.9 % IJ SOLN
INTRAMUSCULAR | Status: AC
  Filled 2014-11-25: qty 10

## 2014-11-25 MED ORDER — PROPOFOL 10 MG/ML IV BOLUS
INTRAVENOUS | Status: AC
  Filled 2014-11-25: qty 20

## 2014-11-25 MED ORDER — BUPIVACAINE LIPOSOME 1.3 % IJ SUSP
INTRAMUSCULAR | Status: AC
  Filled 2014-11-25: qty 20

## 2014-11-25 MED ORDER — PHENOL 1.4 % MT LIQD: 1.0000 | OROMUCOSAL | Status: DC | PRN

## 2014-11-25 MED ORDER — MIDAZOLAM HCL 2 MG/2ML IJ SOLN
INTRAMUSCULAR | Status: AC
  Filled 2014-11-25: qty 2

## 2014-11-25 MED ORDER — BISACODYL 5 MG PO TBEC: 5.0000 mg | DELAYED_RELEASE_TABLET | Freq: Every day | ORAL | Status: DC | PRN

## 2014-11-25 MED ORDER — BUPIVACAINE IN DEXTROSE 0.75-8.25 % IT SOLN
INTRATHECAL | Status: DC | PRN
  Administered 2014-11-25: 1.5 mL via INTRATHECAL

## 2014-11-25 MED ORDER — SODIUM CHLORIDE 0.9 % IV SOLN
INTRAVENOUS | Status: DC
  Administered 2014-11-25 – 2014-11-26 (×2): via INTRAVENOUS

## 2014-11-25 MED ORDER — BUPIVACAINE LIPOSOME 1.3 % IJ SUSP
INTRAMUSCULAR | Status: DC | PRN
  Administered 2014-11-25: 60 mL

## 2014-11-25 MED ORDER — DOCUSATE SODIUM 100 MG PO CAPS
100.0000 mg | ORAL_CAPSULE | Freq: Two times a day (BID) | ORAL | Status: DC
  Administered 2014-11-25 – 2014-11-27 (×5): 100 mg via ORAL
  Filled 2014-11-25 (×5): qty 1

## 2014-11-25 MED ORDER — METHOCARBAMOL 500 MG PO TABS
500.0000 mg | ORAL_TABLET | Freq: Four times a day (QID) | ORAL | Status: DC | PRN
  Administered 2014-11-27: 500 mg via ORAL
  Filled 2014-11-25: qty 1

## 2014-11-25 MED ORDER — SODIUM CHLORIDE 0.9 % IR SOLN
Status: DC | PRN
  Administered 2014-11-25: 1000 mL

## 2014-11-25 MED ORDER — LIDOCAINE HCL (PF) 1 % IJ SOLN
INTRAMUSCULAR | Status: AC
  Filled 2014-11-25: qty 5

## 2014-11-25 MED ORDER — ONDANSETRON HCL 4 MG/2ML IJ SOLN: 4.0000 mg | Freq: Once | INTRAMUSCULAR | Status: DC | PRN

## 2014-11-25 MED ORDER — CHLORHEXIDINE GLUCONATE 4 % EX LIQD: 60.0000 mL | Freq: Once | CUTANEOUS | Status: DC

## 2014-11-25 MED ORDER — SODIUM CHLORIDE 0.9 % IR SOLN
Status: DC | PRN
  Administered 2014-11-25: 3000 mL

## 2014-11-25 MED ORDER — ONDANSETRON HCL 4 MG/2ML IJ SOLN
4.0000 mg | Freq: Four times a day (QID) | INTRAMUSCULAR | Status: DC | PRN
  Administered 2014-11-26: 4 mg via INTRAVENOUS
  Filled 2014-11-25: qty 2

## 2014-11-25 MED ORDER — HYDROCODONE-ACETAMINOPHEN 5-325 MG PO TABS
1.0000 | ORAL_TABLET | ORAL | Status: DC | PRN
  Administered 2014-11-25 – 2014-11-26 (×2): 1 via ORAL
  Filled 2014-11-25 (×2): qty 1

## 2014-11-25 MED ORDER — METOCLOPRAMIDE HCL 10 MG PO TABS
5.0000 mg | ORAL_TABLET | Freq: Three times a day (TID) | ORAL | Status: DC | PRN
Start: 2014-11-25 — End: 2014-11-27

## 2014-11-25 MED ORDER — ASPIRIN EC 325 MG PO TBEC
325.0000 mg | DELAYED_RELEASE_TABLET | Freq: Two times a day (BID) | ORAL | Status: DC
  Administered 2014-11-25 – 2014-11-27 (×5): 325 mg via ORAL
  Filled 2014-11-25 (×5): qty 1

## 2014-11-25 MED ORDER — ADULT MULTIVITAMIN W/MINERALS CH
ORAL_TABLET | Freq: Every day | ORAL | Status: DC
  Administered 2014-11-25 – 2014-11-27 (×3): 1 via ORAL
  Filled 2014-11-25 (×3): qty 1

## 2014-11-25 MED ORDER — SODIUM CHLORIDE 0.9 % IJ SOLN
INTRAMUSCULAR | Status: AC
  Filled 2014-11-25: qty 20

## 2014-11-25 MED ORDER — BUPIVACAINE-EPINEPHRINE (PF) 0.5% -1:200000 IJ SOLN
INTRAMUSCULAR | Status: AC
  Filled 2014-11-25: qty 30

## 2014-11-25 MED ORDER — EPHEDRINE SULFATE 50 MG/ML IJ SOLN
INTRAMUSCULAR | Status: AC
  Filled 2014-11-25: qty 1

## 2014-11-25 MED ORDER — MIDAZOLAM HCL 2 MG/2ML IJ SOLN
1.0000 mg | INTRAMUSCULAR | Status: DC | PRN
  Administered 2014-11-25: 2 mg via INTRAVENOUS
  Filled 2014-11-25: qty 2

## 2014-11-25 MED ORDER — CALCIUM CARBONATE-VITAMIN D 500-200 MG-UNIT PO TABS
ORAL_TABLET | Freq: Every day | ORAL | Status: DC
  Administered 2014-11-25 – 2014-11-27 (×3): 1 via ORAL
  Filled 2014-11-25 (×3): qty 1

## 2014-11-25 MED ORDER — PHENYLEPHRINE HCL 10 MG/ML IJ SOLN
INTRAMUSCULAR | Status: AC
  Filled 2014-11-25: qty 1

## 2014-11-25 MED ORDER — FENTANYL CITRATE 0.05 MG/ML IJ SOLN
INTRAMUSCULAR | Status: AC
  Filled 2014-11-25: qty 2

## 2014-11-25 MED ORDER — METOCLOPRAMIDE HCL 5 MG/ML IJ SOLN: 5.0000 mg | Freq: Three times a day (TID) | INTRAMUSCULAR | Status: DC | PRN

## 2014-11-25 MED ORDER — HYDROCODONE-ACETAMINOPHEN 5-325 MG PO TABS
1.0000 | ORAL_TABLET | Freq: Once | ORAL | Status: AC
  Administered 2014-11-25: 1 via ORAL
  Filled 2014-11-25: qty 1

## 2014-11-25 MED ORDER — DIPHENHYDRAMINE HCL 12.5 MG/5ML PO ELIX: 12.5000 mg | ORAL_SOLUTION | ORAL | Status: DC | PRN

## 2014-11-25 MED ORDER — PHENYLEPHRINE HCL 10 MG/ML IJ SOLN
INTRAMUSCULAR | Status: DC | PRN
  Administered 2014-11-25 (×6): 50 ug via INTRAVENOUS
  Administered 2014-11-25: 25 ug via INTRAVENOUS
  Administered 2014-11-25: 50 ug via INTRAVENOUS

## 2014-11-25 MED ORDER — EPHEDRINE SULFATE 50 MG/ML IJ SOLN
INTRAMUSCULAR | Status: DC | PRN
  Administered 2014-11-25 (×3): 5 mg via INTRAVENOUS

## 2014-11-25 MED ORDER — CEFAZOLIN SODIUM 1-5 GM-% IV SOLN
1.0000 g | Freq: Four times a day (QID) | INTRAVENOUS | Status: AC
  Administered 2014-11-25 (×2): 1 g via INTRAVENOUS
  Filled 2014-11-25 (×2): qty 50

## 2014-11-25 MED ORDER — MENTHOL 3 MG MT LOZG: 1.0000 | LOZENGE | OROMUCOSAL | Status: DC | PRN

## 2014-11-25 SURGICAL SUPPLY — 70 items
BIT DRILL 2.8X128 (BIT) ×2 IMPLANT
BLADE HEX COATED 2.75 (ELECTRODE) ×2 IMPLANT
BLADE SAGITTAL 25.0X1.27X90 (BLADE) ×2 IMPLANT
BRUSH FEMORAL CANAL (MISCELLANEOUS) IMPLANT
CAPT HIP TOTAL 2 ×2 IMPLANT
CATH KIT ON Q 2.5IN SLV (PAIN MANAGEMENT) IMPLANT
CLOTH BEACON ORANGE TIMEOUT ST (SAFETY) ×2 IMPLANT
COVER LIGHT HANDLE STERIS (MISCELLANEOUS) ×4 IMPLANT
COVER PROBE W GEL 5X96 (DRAPES) ×2 IMPLANT
DECANTER SPIKE VIAL GLASS SM (MISCELLANEOUS) ×2 IMPLANT
DRAPE BACK TABLE (DRAPES) ×2 IMPLANT
DRAPE HIP W/POCKET STRL (DRAPE) ×2 IMPLANT
DRAPE INCISE IOBAN 44X35 STRL (DRAPES) ×2 IMPLANT
DRAPE U-SHAPE 47X51 STRL (DRAPES) ×2 IMPLANT
DRSG MEPILEX BORDER 4X12 (GAUZE/BANDAGES/DRESSINGS) ×2 IMPLANT
DURAPREP 26ML APPLICATOR (WOUND CARE) ×4 IMPLANT
ELECT REM PT RETURN 9FT ADLT (ELECTROSURGICAL) ×2
ELECTRODE REM PT RTRN 9FT ADLT (ELECTROSURGICAL) ×1 IMPLANT
EVACUATOR 3/16  PVC DRAIN (DRAIN)
EVACUATOR 3/16 PVC DRAIN (DRAIN) IMPLANT
FACESHIELD OPICON STD (MASK) ×2 IMPLANT
GLOVE BIOGEL M 7.0 STRL (GLOVE) ×8 IMPLANT
GLOVE BIOGEL PI IND STRL 7.0 (GLOVE) ×5 IMPLANT
GLOVE BIOGEL PI IND STRL 7.5 (GLOVE) ×1 IMPLANT
GLOVE BIOGEL PI INDICATOR 7.0 (GLOVE) ×5
GLOVE BIOGEL PI INDICATOR 7.5 (GLOVE) ×1
GLOVE EXAM NITRILE MD LF STRL (GLOVE) ×4 IMPLANT
GLOVE OPTIFIT SS 8.0 STRL (GLOVE) ×2 IMPLANT
GLOVE SKINSENSE NS SZ8.0 LF (GLOVE) ×2
GLOVE SKINSENSE STRL SZ8.0 LF (GLOVE) ×2 IMPLANT
GLOVE SS N UNI LF 8.5 STRL (GLOVE) ×2 IMPLANT
GOWN STRL REUS W/TWL LRG LVL3 (GOWN DISPOSABLE) ×8 IMPLANT
GOWN STRL REUS W/TWL XL LVL3 (GOWN DISPOSABLE) ×2 IMPLANT
HANDPIECE INTERPULSE COAX TIP (DISPOSABLE) ×1
HOOD W/PEELAWAY (MISCELLANEOUS) ×10 IMPLANT
INST SET MAJOR BONE (KITS) ×2 IMPLANT
IV NS IRRIG 3000ML ARTHROMATIC (IV SOLUTION) ×2 IMPLANT
KIT BLADEGUARD II DBL (SET/KITS/TRAYS/PACK) ×2 IMPLANT
KIT ROOM TURNOVER APOR (KITS) ×2 IMPLANT
MANIFOLD NEPTUNE II (INSTRUMENTS) ×2 IMPLANT
MARKER SKIN DUAL TIP RULER LAB (MISCELLANEOUS) ×2 IMPLANT
NEEDLE HYPO 18GX1.5 BLUNT FILL (NEEDLE) ×4 IMPLANT
NEEDLE HYPO 21X1.5 SAFETY (NEEDLE) ×4 IMPLANT
NEEDLE HYPO 25X1 1.5 SAFETY (NEEDLE) ×2 IMPLANT
NS IRRIG 1000ML POUR BTL (IV SOLUTION) ×2 IMPLANT
PACK TOTAL JOINT (CUSTOM PROCEDURE TRAY) ×2 IMPLANT
PAD ARMBOARD 7.5X6 YLW CONV (MISCELLANEOUS) ×2 IMPLANT
PASSER SUT SWANSON 36MM LOOP (INSTRUMENTS) IMPLANT
PILLOW HIP ABDUCTION LRG (ORTHOPEDIC SUPPLIES) IMPLANT
PILLOW HIP ABDUCTION MED (ORTHOPEDIC SUPPLIES) IMPLANT
PILLOW HIP ABDUCTION SM (ORTHOPEDIC SUPPLIES) ×2 IMPLANT
PIN STMN SNGL STERILE 9X3.6MM (PIN) ×4 IMPLANT
SET BASIN LINEN APH (SET/KITS/TRAYS/PACK) ×2 IMPLANT
SET HNDPC FAN SPRY TIP SCT (DISPOSABLE) ×1 IMPLANT
SPONGE LAP 18X18 X RAY DECT (DISPOSABLE) ×2 IMPLANT
STAPLER VISISTAT 35W (STAPLE) ×2 IMPLANT
SUT BRALON NAB BRD #1 30IN (SUTURE) ×4 IMPLANT
SUT ETHIBOND 5 LR DA (SUTURE) ×4 IMPLANT
SUT MNCRL 0 VIOLET CTX 36 (SUTURE) ×1 IMPLANT
SUT MON AB 2-0 CT1 36 (SUTURE) ×2 IMPLANT
SUT MONOCRYL 0 CTX 36 (SUTURE) ×1
SUT VIC AB 1 CT1 27 (SUTURE) ×2
SUT VIC AB 1 CT1 27XBRD ANTBC (SUTURE) ×2 IMPLANT
SYR 20CC LL (SYRINGE) ×4 IMPLANT
SYR 30ML LL (SYRINGE) ×2 IMPLANT
SYR BULB IRRIGATION 50ML (SYRINGE) ×2 IMPLANT
TOWEL OR 17X26 4PK STRL BLUE (TOWEL DISPOSABLE) ×2 IMPLANT
TOWER CARTRIDGE SMART MIX (DISPOSABLE) IMPLANT
TRAY FOLEY CATH 16FR SILVER (SET/KITS/TRAYS/PACK) ×2 IMPLANT
YANKAUER SUCT 12FT TUBE ARGYLE (SUCTIONS) ×2 IMPLANT

## 2014-11-25 NOTE — Transfer of Care (Signed)
Immediate Anesthesia Transfer of Care Note  Patient: Andrea Mays  Procedure(s) Performed: Procedure(s): LEFT TOTAL HIP ARTHROPLASTY (Left)  Patient Location: PACU  Anesthesia Type:Spinal  Level of Consciousness: awake, alert  and oriented  Airway & Oxygen Therapy: Patient Spontanous Breathing  Post-op Assessment: Report given to RN and Post -op Vital signs reviewed and stable  Post vital signs: Reviewed and stable  Last Vitals:  Filed Vitals:   11/25/14 0725  BP: 105/69  Temp:   Resp: 63    Complications: No apparent anesthesia complications

## 2014-11-25 NOTE — Anesthesia Preprocedure Evaluation (Addendum)
Anesthesia Evaluation  Patient identified by MRN, date of birth, ID band Patient awake    Reviewed: Allergy & Precautions, NPO status , Patient's Chart, lab work & pertinent test results  Airway Mallampati: I  TM Distance: >3 FB     Dental  (+) Teeth Intact, Implants   Pulmonary neg pulmonary ROS,  breath sounds clear to auscultation        Cardiovascular negative cardio ROS  Rhythm:Regular Rate:Normal     Neuro/Psych    GI/Hepatic negative GI ROS,   Endo/Other    Renal/GU      Musculoskeletal  (+) Arthritis -, Osteoarthritis,    Abdominal   Peds  Hematology   Anesthesia Other Findings   Reproductive/Obstetrics                             Anesthesia Physical Anesthesia Plan  ASA: I  Anesthesia Plan: Spinal   Post-op Pain Management:    Induction: Intravenous  Airway Management Planned: Simple Face Mask  Additional Equipment:   Intra-op Plan:   Post-operative Plan:   Informed Consent: I have reviewed the patients History and Physical, chart, labs and discussed the procedure including the risks, benefits and alternatives for the proposed anesthesia with the patient or authorized representative who has indicated his/her understanding and acceptance.     Plan Discussed with:   Anesthesia Plan Comments:        Anesthesia Quick Evaluation

## 2014-11-25 NOTE — Anesthesia Procedure Notes (Signed)
Spinal Patient location during procedure: OR Start time: 11/25/2014 7:48 AM Staffing Resident/CRNA: Tressie Stalker E Preanesthetic Checklist Completed: patient identified, site marked, surgical consent, pre-op evaluation, timeout performed, IV checked, risks and benefits discussed and monitors and equipment checked Spinal Block Patient position: left lateral decubitus Prep: Betadine Patient monitoring: heart rate, cardiac monitor, continuous pulse ox and blood pressure Approach: left paramedian Location: L3-4 Injection technique: single-shot Needle Needle type: Spinocan  Needle gauge: 22 G Needle length: 9 cm Assessment Sensory level: T8 Additional Notes  ATTEMPTS:1 TRAY YK:[id number] TRAY EXPIRATION DATE:06/2015

## 2014-11-25 NOTE — Brief Op Note (Signed)
11/25/2014  10:01 AM  PATIENT:  Andrea Mays  64 y.o. female  PRE-OPERATIVE DIAGNOSIS:  OSTEOARTHRITIS LEFT HIP  POST-OPERATIVE DIAGNOSIS:  OSTEOARTHRITIS LEFT HIP  FINDINGS: SEVERE OA LEFT HIP  DETAILS OF PROCEDURE The patient was identified in the preop holding area and the surgical site was confirmed and marked as left hip. The patient was then taken to the operating room for spinal anesthesia. She was placed in the lateral decubitus position with the Stulberg hip positioner with the left side up.  A Foley catheter was inserted and the left lower extremity was prepped from pelvis to include the foot with ChloraPrep followed by sterile draping.  Timeout was completed implants were present site confirmation was complete x-rays were available.  The incision was started over the greater trochanter extended proximally and distally. The subcutaneous tissue was divided. The fascia was identified. The fascia was split in line with the skin incision. The abductor musculature was identified and the greater trochanteric bursa was removed. The anterior half of the abductor mechanism including the gluteus minimus was subperiosteally dissected from the greater trochanter in continuity with the vastus lateralis separating it from the underlying hip capsule and retracted anteriorly and superiorly and held in place with 2 Steinmann pins placed in the pelvis  Capsulectomy was then performed  We found significant tightness in the iliopsoas muscle.; osteophytes were removed. The hip was then dislocated the femoral head was severely altered in its shape there are severe varus alignment to the head and neck. The cutting guide was used to resect the femoral head approximately one and half centimeters above the lesser trochanter which was exposed with further subperiosteal dissection  The leg was placed in a sterile bag. Anterior and posterior retractors were placed exposing the acetabulum and reaming was  performed starting with a 43 reamer up to a size 47. A trial 47 cup bottomed out and we chose a 48 acetabular shell.  We prepared the proximal femur with starter hole reamer, box osteotome and canal finder. We broached with a starter and 0 broach placed a 0 stem placed a 32 head with a 5 and then a 1.5 neck length and the hip was not reducible. We therefore switched was Summit stem  We broached with a size 1 further resected the neck followed by calcar planer then used a 1.5 x 32 head with a 32 liner in the acetabular shell and this gave excellent reduction and stability to the hip which was checked by shuck test, leg length check internal and external rotation hyperflexion and sleep position  The trial stem was removed the wound was thoroughly irrigated at [facility] injected drill holes were placed in the femoral stem #5 Ethibond suture was passed the stem was placed the head was placed the real liner was placed and reduction was repeated. Reduction matched trial reduction.  The vastus lateralis gluteus sleeve was repaired with #1 Bralon and #5 Ethibond. This was done with the leg in slight internal rotation.  Further X Burrell injection was performed.  Fascia was closed with #1 rail on and #5 Ethibond. Subcutaneous tissue was closed with 2 layers of suture using 0 Monocryl and 2-0 Monocryl. Staples used to reapproximate the skin. Marcaine injected subcutaneous tissues.  Sterile dressing applied.  Final leg length check on the bed show that the leg lengths were equal.  Patient taken to the recovery room on her regular bed in stable condition   After insertion of the shell the hip was further  dislocated  PROCEDURE:  Procedure(s): LEFT TOTAL HIP ARTHROPLASTY (Left)   DEPUY HOLE ELIMINATOR PINNACLE GRIPTION SECTOR 48 ACETABULAR SHELL PINNACLE ALTRX POLYETHYLENE LINER +4 NEUTRAL LINER 32 ID 48OD SUMMIT POROCOAT SIZE 1 HO STEM  32/1.5 FEMORAL HEAD   SURGEON:  Surgeon(s) and Role:    *  Carole Civil, MD - Primary  PHYSICIAN ASSISTANT:   ASSISTANTS: BETTY ASHLEY    ANESTHESIA:   spinal  EBL:  Total I/O In: 1400 [I.V.:1400] Out: 450 [Urine:250; Blood:200]  BLOOD ADMINISTERED:none  DRAINS: none   LOCAL MEDICATIONS USED:  MARCAINE   , Amount: WITH EPI 30 ml and OTHER EXPAREL 20 MM  SPECIMEN:  No Specimen  DISPOSITION OF SPECIMEN:  N/A  COUNTS:  YES  TOURNIQUET:  * No tourniquets in log *  DICTATION: .Dragon Dictation  PLAN OF CARE: Admit to inpatient   PATIENT DISPOSITION:  PACU - hemodynamically stable.   Delay start of Pharmacological VTE agent (>24hrs) due to surgical blood loss or risk of bleeding: yes

## 2014-11-25 NOTE — Progress Notes (Signed)
Per pt and family's request, pt wants a female nurse and nurse tech. Pt's left lower extremity are warm to touch. Pulses are moderate. Dressing to hip is intact. Ice is applied to the left hip. Pt states she is in a small amount of pain and does not want to take anything stronger than Tylenol. Educated pt and family that she has a stronger pain medicine available if needed. Will continue to monitor pt.

## 2014-11-25 NOTE — Op Note (Signed)
11/25/2014  10:01 AM  PATIENT:  Andrea Mays  64 y.o. female  PRE-OPERATIVE DIAGNOSIS:  OSTEOARTHRITIS LEFT HIP  POST-OPERATIVE DIAGNOSIS:  OSTEOARTHRITIS LEFT HIP  FINDINGS: SEVERE OA LEFT HIP  DETAILS OF PROCEDURE The patient was identified in the preop holding area and the surgical site was confirmed and marked as left hip. The patient was then taken to the operating room for spinal anesthesia. She was placed in the lateral decubitus position with the Stulberg hip positioner with the left side up.  A Foley catheter was inserted and the left lower extremity was prepped from pelvis to include the foot with ChloraPrep followed by sterile draping.  Timeout was completed implants were present site confirmation was complete x-rays were available.  The incision was started over the greater trochanter extended proximally and distally. The subcutaneous tissue was divided. The fascia was identified. The fascia was split in line with the skin incision. The abductor musculature was identified and the greater trochanteric bursa was removed. The anterior half of the abductor mechanism including the gluteus minimus was subperiosteally dissected from the greater trochanter in continuity with the vastus lateralis separating it from the underlying hip capsule and retracted anteriorly and superiorly and held in place with 2 Steinmann pins placed in the pelvis  Capsulectomy was then performed  We found significant tightness in the iliopsoas muscle.; osteophytes were removed. The hip was then dislocated the femoral head was severely altered in its shape there are severe varus alignment to the head and neck. The cutting guide was used to resect the femoral head approximately one and half centimeters above the lesser trochanter which was exposed with further subperiosteal dissection  The leg was placed in a sterile bag. Anterior and posterior retractors were placed exposing the acetabulum and reaming was  performed starting with a 43 reamer up to a size 47. A trial 47 cup bottomed out and we chose a 48 acetabular shell.  We prepared the proximal femur with starter hole reamer, box osteotome and canal finder. We broached with a starter and 0 broach placed a 0 stem placed a 32 head with a 5 and then a 1.5 neck length and the hip was not reducible. We therefore switched was Summit stem  We broached with a size 1 further resected the neck followed by calcar planer then used a 1.5 x 32 head with a 32 liner in the acetabular shell and this gave excellent reduction and stability to the hip which was checked by shuck test, leg length check internal and external rotation hyperflexion and sleep position  The trial stem was removed the wound was thoroughly irrigated at [facility] injected drill holes were placed in the femoral stem #5 Ethibond suture was passed the stem was placed the head was placed the real liner was placed and reduction was repeated. Reduction matched trial reduction.  The vastus lateralis gluteus sleeve was repaired with #1 Bralon and #5 Ethibond. This was done with the leg in slight internal rotation.  Further X Burrell injection was performed.  Fascia was closed with #1 rail on and #5 Ethibond. Subcutaneous tissue was closed with 2 layers of suture using 0 Monocryl and 2-0 Monocryl. Staples used to reapproximate the skin. Marcaine injected subcutaneous tissues.  Sterile dressing applied.  Final leg length check on the bed show that the leg lengths were equal.  Patient taken to the recovery room on her regular bed in stable condition   After insertion of the shell the hip was further  dislocated  PROCEDURE:  Procedure(s): LEFT TOTAL HIP ARTHROPLASTY (Left)   DEPUY HOLE ELIMINATOR PINNACLE GRIPTION SECTOR 48 ACETABULAR SHELL PINNACLE ALTRX POLYETHYLENE LINER +4 NEUTRAL LINER 32 ID 48OD SUMMIT POROCOAT SIZE 1 HO STEM  32/1.5 FEMORAL HEAD   SURGEON:  Surgeon(s) and Role:    *  Carole Civil, MD - Primary  PHYSICIAN ASSISTANT:   ASSISTANTS: BETTY ASHLEY    ANESTHESIA:   spinal  EBL:  Total I/O In: 1400 [I.V.:1400] Out: 450 [Urine:250; Blood:200]  BLOOD ADMINISTERED:none  DRAINS: none   LOCAL MEDICATIONS USED:  MARCAINE   , Amount: WITH EPI 30 ml and OTHER EXPAREL 20 MM  SPECIMEN:  No Specimen  DISPOSITION OF SPECIMEN:  N/A  COUNTS:  YES  TOURNIQUET:  * No tourniquets in log *  DICTATION: .Dragon Dictation  PLAN OF CARE: Admit to inpatient   PATIENT DISPOSITION:  PACU - hemodynamically stable.   Delay start of Pharmacological VTE agent (>24hrs) due to surgical blood loss or risk of bleeding: yes

## 2014-11-25 NOTE — Progress Notes (Signed)
Pt had spinal.   Therapist was going to attempt bed exercises but pt refused.  Therapist attempted to obtain previous functional level from pt but pt stated she wanted her husband to answer.  Will see pt tomorrow.

## 2014-11-25 NOTE — Interval H&P Note (Signed)
History and Physical Interval Note:  11/25/2014 7:17 AM  Andrea Mays  has presented today for surgery, with the diagnosis of OSTEOARTHRITIS LEFT HIP  The various methods of treatment have been discussed with the patient and family. After consideration of risks, benefits and other options for treatment, the patient has consented to  Procedure(s): TOTAL HIP ARTHROPLASTY (Left) as a surgical intervention .  The patient's history has been reviewed, patient examined, no change in status, stable for surgery.  I have reviewed the patient's chart and labs.  Questions were answered to the patient's satisfaction.     Arther Abbott

## 2014-11-25 NOTE — Addendum Note (Signed)
Addendum  created 11/25/14 1108 by Ollen Bowl, CRNA   Modules edited: Notes Section   Notes Section:  File: [id number]

## 2014-11-25 NOTE — Anesthesia Postprocedure Evaluation (Signed)
  Anesthesia Post-op Note  Patient: Andrea Mays  Procedure(s) Performed: Procedure(s): LEFT TOTAL HIP ARTHROPLASTY (Left)  Patient Location: PACU  Anesthesia Type:Spinal  Level of Consciousness: awake, alert  and patient cooperative  Airway and Oxygen Therapy: Patient Spontanous Breathing and Patient connected to nasal cannula oxygen  Post-op Pain: none  Post-op Assessment: Post-op Vital signs reviewed, Patient's Cardiovascular Status Stable, Respiratory Function Stable, Patent Airway and No signs of Nausea or vomiting  Post-op Vital Signs: Reviewed and stable  Last Vitals:  Filed Vitals:   11/25/14 1030  BP: 97/62  Pulse: 79  Temp:   Resp: 13    Complications: No apparent anesthesia complications

## 2014-11-25 NOTE — Telephone Encounter (Signed)
Regarding in-patient surgery scheduled at [facility] 11/25/14, CPT 27130, total hip arthroplasty.  Per primary insurer Medicare's guidelines, no pre-authorization required.  Per secondary insurer, [employer], [phone], per Mickel Baas, no pre-authorization required, as the plan follows Medicare guidelines.

## 2014-11-25 NOTE — Anesthesia Postprocedure Evaluation (Signed)
  Anesthesia Post-op Note  Patient: Andrea Mays  Procedure(s) Performed: Procedure(s): LEFT TOTAL HIP ARTHROPLASTY (Left)  Patient Location: PACU  Anesthesia Type:Spinal  Level of Consciousness: awake, alert  and oriented  Airway and Oxygen Therapy: Patient Spontanous Breathing and Patient connected to nasal cannula oxygen  Post-op Pain: none  Post-op Assessment: Post-op Vital signs reviewed, Patient's Cardiovascular Status Stable, Respiratory Function Stable, Patent Airway and No signs of Nausea or vomiting  Post-op Vital Signs: Reviewed and stable  Last Vitals:  Filed Vitals:   11/25/14 1045  BP: 102/67  Pulse: 82  Temp:   Resp: 13    Complications: No apparent anesthesia complications

## 2014-11-26 ENCOUNTER — Encounter (HOSPITAL_COMMUNITY): Payer: Self-pay | Admitting: Orthopedic Surgery

## 2014-11-26 LAB — BASIC METABOLIC PANEL
Anion gap: 5 (ref 5–15)
BUN: 11 mg/dL (ref 6–23)
CO2: 23 mmol/L (ref 19–32)
CREATININE: 0.51 mg/dL (ref 0.50–1.10)
Calcium: 7.9 mg/dL — ABNORMAL LOW (ref 8.4–10.5)
Chloride: 105 mmol/L (ref 96–112)
GFR calc Af Amer: >90 mL/min (ref >90)
GLUCOSE: 164 mg/dL — AB (ref 70–99)
POTASSIUM: 3.6 mmol/L (ref 3.5–5.1)
Sodium: 133 mmol/L — ABNORMAL LOW (ref 135–145)

## 2014-11-26 LAB — CBC
HCT: 35.9 % — ABNORMAL LOW (ref 36.0–46.0)
Hemoglobin: 11.7 g/dL — ABNORMAL LOW (ref 12.0–15.0)
MCH: 28.4 pg (ref 26.0–34.0)
MCHC: 32.6 g/dL (ref 30.0–36.0)
MCV: 87.1 fL (ref 78.0–100.0)
PLATELETS: 286 10*3/uL (ref 150–400)
RBC: 4.12 MIL/uL (ref 3.87–5.11)
RDW: 13 % (ref 11.5–15.5)
WBC: 10.3 10*3/uL (ref 4.0–10.5)

## 2014-11-26 MED ORDER — HYDROCODONE-ACETAMINOPHEN 5-325 MG PO TABS
1.0000 | ORAL_TABLET | ORAL | Status: DC
  Administered 2014-11-26 – 2014-11-27 (×7): 1 via ORAL
  Filled 2014-11-26 (×7): qty 1

## 2014-11-26 NOTE — Anesthesia Postprocedure Evaluation (Signed)
  Anesthesia Post-op Note  Patient: Andrea Mays  Procedure(s) Performed: Procedure(s): LEFT TOTAL HIP ARTHROPLASTY (Left)  Patient Location:Room 312  Anesthesia Type:Spinal  Level of Consciousness: awake, alert , oriented and patient cooperative  Airway and Oxygen Therapy: Patient Spontanous Breathing  Post-op Pain: mild  Post-op Assessment: Post-op Vital signs reviewed, Patient's Cardiovascular Status Stable, Respiratory Function Stable, Patent Airway, No signs of Nausea or vomiting and Pain level controlled  Post-op Vital Signs: Reviewed and stable  Last Vitals:  Filed Vitals:   11/26/14 1112  BP:   Pulse:   Temp:   Resp: 18    Complications: No apparent anesthesia complications

## 2014-11-26 NOTE — Evaluation (Signed)
Physical Therapy Evaluation Patient Details Name: Andrea Mays MRN: [id number] DOB: 1951/05/08 Today's Date: 11/26/2014   History of Present Illness  64 year old [place] female who presents with a 4 year history of progressively increasing left hip pain radiating down her leg and thigh with some pain into the left lower leg but denies any back pain. She has opted to have a THR.    Clinical Impression  Pt was seen for initial visit this morning.  Husband was present.  Pt is from [place] with her husband and they speak English but pt does not feel secure with her grasp of the language so she prefers that her husband be present for therapy.  Pt was alert, oriented and very cooperative with pain well controlled.  She and husband were instructed in anterior hip precautions and we will provide them with the hip booklet at next visit.  Pt was able to tolerate therapeutic exercise per protocol and is able to perform hip flexion and short arc quad independently.  She needed only min assist to transfer out of bed and was able to ambulate 69' with a stable gait pattern using a walker.  She had no significant pain throughout session.  She is doing extremely well and should progress rapidly.  She plans to return home at d/c with assist of husband.    Follow Up Recommendations Home health PT    Equipment Recommendations  Rolling walker with 5" wheels;3in1 (PT) (walker will need to be a Junior size)    Recommendations for Other Services OT consult     Precautions / Restrictions Precautions Precautions: Anterior Hip;Fall Precaution Booklet Issued: Yes (comment) Restrictions Weight Bearing Restrictions: No      Mobility  Bed Mobility Overal bed mobility: Needs Assistance Bed Mobility: Supine to Sit     Supine to sit: Min guard;HOB elevated        Transfers Overall transfer level: Needs assistance Equipment used: Rolling walker (2 wheeled) Transfers: Sit to/from Stand Sit to Stand:  Supervision            Ambulation/Gait Ambulation/Gait assistance: Supervision Ambulation Distance (Feet): 60 Feet Assistive device: Rolling walker (2 wheeled) Gait Pattern/deviations: Antalgic Gait velocity: appropriate for situation Gait velocity interpretation: Below normal speed for age/gender    Stairs            Wheelchair Mobility    Modified Rankin (Stroke Patients Only)       Balance Overall balance assessment: Modified Independent                                           Pertinent Vitals/Pain Pain Assessment: No/denies pain    Home Living Family/patient expects to be discharged to:: Private residence Living Arrangements: Spouse/significant other Available Help at Discharge: Family;Available 24 hours/day Type of Home: House Home Access: Level entry     Home Layout: One level Home Equipment: None      Prior Function Level of Independence: Independent               Hand Dominance        Extremity/Trunk Assessment               Lower Extremity Assessment: LLE deficits/detail   LLE Deficits / Details: generalized post op weakness in the LLE but is able to do a heel slide independently as well as a short arc quad  Cervical /  Trunk Assessment: Normal  Communication   Communication: No difficulties;Interpreter utilized (Pt does speak English but is not secure with it so prefers that her husband is present)  Cognition Arousal/Alertness: Awake/alert Behavior During Therapy: WFL for tasks assessed/performed Overall Cognitive Status: Within Functional Limits for tasks assessed                      General Comments      Exercises Total Joint Exercises Ankle Circles/Pumps: AROM;Both;10 reps;Supine Quad Sets: AROM;Both;10 reps;Supine Gluteal Sets: AROM;Both;10 reps;Supine Short Arc Quad: AROM;Left;10 reps;Supine Heel Slides: AROM;Left;AAROM;10 reps;Supine      Assessment/Plan    PT Assessment  Patient needs continued PT services  PT Diagnosis Difficulty walking;Generalized weakness;Acute pain   PT Problem List Decreased strength;Decreased activity tolerance;Decreased mobility;Decreased knowledge of use of DME;Decreased knowledge of precautions  PT Treatment Interventions Gait training;Functional mobility training;Therapeutic exercise   PT Goals (Current goals can be found in the Care Plan section) Acute Rehab PT Goals Patient Stated Goal: return home with no pain PT Goal Formulation: With patient/family Time For Goal Achievement: 12/10/14 Potential to Achieve Goals: Good    Frequency 7X/week   Barriers to discharge   none    Co-evaluation               End of Session Equipment Utilized During Treatment: Gait belt Activity Tolerance: Patient tolerated treatment well Patient left: in chair;with call bell/phone within reach;with chair alarm set           Time: 7622-6333 PT Time Calculation (min) (ACUTE ONLY): 31 min   Charges:   PT Evaluation $Initial PT Evaluation Tier I: 1 Procedure PT Treatments $Self Care/Home Management: 8-22   PT G Codes:        Sable Feil 11/26/2014, 12:39 PM

## 2014-11-26 NOTE — Addendum Note (Signed)
Addendum  created 11/26/14 1303 by Mickel Baas, CRNA   Modules edited: Notes Section   Notes Section:  File: [id number]

## 2014-11-26 NOTE — Progress Notes (Signed)
Pt had one vomiting episode. Emesis was brown in color. Pt states she believes it was food she ate from home. Administered Zofran to pt. Will continue to monitor pt.

## 2014-11-26 NOTE — Care Management Note (Addendum)
    Page 1 of 2   11/27/2014     9:48:54 AM CARE MANAGEMENT NOTE 11/27/2014  Patient:  Andrea Mays, Andrea Mays   Account Number:  [id number]  Date Initiated:  11/26/2014  Documentation initiated by:  Jolene Provost  Subjective/Objective Assessment:   Pt is from home with husband. Pt speaks little english but is assisted by husband. Pt s/p hip surgery. Pt plans to discharge home with [facility] PT. Pt has choosen [facility] for [facility] services and DME's.     Action/Plan:   Romualdo Bolk, of [facility] notified of [facility] PT referral and will obtain pt information from chart. Ernestina [place] of [facility] notified of DME orders and will obtain pt information from chart. Will cont to follow for CM needs.   Anticipated DC Date:  11/27/2014   Anticipated DC Plan:  [place] Planning Services  CM consult      PAC Choice  [place]   Choice offered to / List presented to:  C-1 Patient   DME arranged  3-N-1  Vassie Moselle      DME agency  [place] arranged  [place].   Status of service:  In process, will continue to follow Medicare Important Message given?  YES (If response is "NO", the following Medicare IM given date fields will be blank) Date Medicare IM given:  11/27/2014 Medicare IM given by:  Jolene Provost Date Additional Medicare IM given:   Additional Medicare IM given by:    Discharge Disposition:  [place]  Per UR Regulation:  Reviewed for med. necessity/level of care/duration of stay  If discussed at [place] of Stay Meetings, dates discussed:    Comments:  11/27/2014 [place], RN, MSN, CM Pt plans to discharge home today with [facility] PT. [facility] aware of discharge today and will deliver walker and 3 in 1 to pt's room prior to discharge. No further CM needs. 11/26/2014 [place], RN, MSN, CM

## 2014-11-26 NOTE — Clinical Social Work Note (Signed)
CSW received consult for possible SNF. Pt worked with PT today and recommendation is for home health. CSW will sign off, but can be reconsulted if needed.  Benay Pike, [place]

## 2014-11-26 NOTE — Progress Notes (Signed)
  POD # 1 left tha   VS BP 101/59 mmHg  Pulse 113  Temp(Src) 98.5 F (36.9 C) (Oral)  Resp 20  Ht 4\' 8"  (1.422 m)  Wt 107 lb 12.9 oz (48.9 kg)  BMI 24.18 kg/m2  SpO2 99%   LABS hemoglobin   DRESSING dry  Neuro-vasculo-motor status of operative limb normal   A/P stable   Start therapy progress as tolerated

## 2014-11-26 NOTE — Care Management Utilization Note (Signed)
UR completed 

## 2014-11-26 NOTE — Progress Notes (Signed)
Removed pt's foley. Pt tolerated well. Foley containe 1800cc;s of urine. Pt's lower extremities warm to touch. Ice pack refilled and applied to left hip. Trapeze bar in place. Monitoring pt for post catheter urine.

## 2014-11-27 LAB — CBC
HCT: 34.2 % — ABNORMAL LOW (ref 36.0–46.0)
Hemoglobin: 11.2 g/dL — ABNORMAL LOW (ref 12.0–15.0)
MCH: 28.6 pg (ref 26.0–34.0)
MCHC: 32.7 g/dL (ref 30.0–36.0)
MCV: 87.5 fL (ref 78.0–100.0)
PLATELETS: 285 10*3/uL (ref 150–400)
RBC: 3.91 MIL/uL (ref 3.87–5.11)
RDW: 13.4 % (ref 11.5–15.5)
WBC: 12.4 10*3/uL — ABNORMAL HIGH (ref 4.0–10.5)

## 2014-11-27 MED ORDER — BISACODYL 5 MG PO TBEC: 5.0000 mg | DELAYED_RELEASE_TABLET | Freq: Every day | ORAL | Status: DC | PRN

## 2014-11-27 MED ORDER — HYDROCODONE-ACETAMINOPHEN 5-325 MG PO TABS: 1.0000 | ORAL_TABLET | ORAL | Status: DC

## 2014-11-27 MED ORDER — ASPIRIN 325 MG PO TBEC: 325.0000 mg | DELAYED_RELEASE_TABLET | Freq: Two times a day (BID) | ORAL | Status: DC

## 2014-11-27 NOTE — Progress Notes (Signed)
Physical Therapy Treatment Patient Details Name: Andrea Mays MRN: [id number] DOB: May 26, 1951 Today's Date: 11/27/2014    History of Present Illness 64 year old [place] female who presents with a 4 year history of progressively increasing left hip pain radiating down her leg and thigh with some pain into the left lower leg but denies any back pain. She has opted to have a THR.      PT Comments    Pt continues to do extremely well.  She was reviewed on THR precautions and she is independent with this.  She was given the anterior THR booklet and a written home exercise program per protocol.  She was instructed in therapeutic exercise and is able to do a heel slide independently.  She was instructed in gait with a walker, WBAT left with a stable but antalgic gait pattern.  She is being discharged to home today.  Follow Up Recommendations  Home health PT     Equipment Recommendations  Rolling walker with 5" wheels;3in1 (PT)    Recommendations for Other Services  none     Precautions / Restrictions Precautions Precautions: Anterior Hip;Fall Precaution Booklet Issued: Yes (comment) Restrictions Weight Bearing Restrictions: No    Mobility  Bed Mobility Overal bed mobility: Modified Independent Bed Mobility: Supine to Sit     Supine to sit: Modified independent (Device/Increase time);HOB elevated        Transfers Overall transfer level: Modified independent Equipment used: Rolling walker (2 wheeled)   Sit to Stand: Modified independent (Device/Increase time)            Ambulation/Gait Ambulation/Gait assistance: Modified independent (Device/Increase time) Ambulation Distance (Feet): 80 Feet Assistive device: Rolling walker (2 wheeled) Gait Pattern/deviations: Decreased stance time - left;Antalgic Gait velocity: appropriate for situation Gait velocity interpretation: Below normal speed for age/gender     Stairs            Wheelchair Mobility    Modified  Rankin (Stroke Patients Only)       Balance Overall balance assessment: No apparent balance deficits (not formally assessed);Modified Independent                                  Cognition Arousal/Alertness: Awake/alert Behavior During Therapy: WFL for tasks assessed/performed Overall Cognitive Status: Within Functional Limits for tasks assessed                      Exercises Total Joint Exercises Ankle Circles/Pumps: AROM;Both;10 reps;Supine Quad Sets: AROM;Both;10 reps;Supine Gluteal Sets: AROM;Both;10 reps;Supine Short Arc Quad: AROM;Left;10 reps;Supine Heel Slides: AROM;Left;AAROM;10 reps;Supine    General Comments        Pertinent Vitals/Pain Pain Assessment: No/denies pain    Home Living                      Prior Function            PT Goals (current goals can now be found in the care plan section) Progress towards PT goals: Goals met/education completed, patient discharged from PT    Frequency       PT Plan Current plan remains appropriate    Co-evaluation             End of Session   Activity Tolerance: Patient tolerated treatment well Patient left: in bed;with call bell/phone within reach;with family/visitor present     Time: 0920-0956 PT Time Calculation (min) (ACUTE ONLY): 36 min  Charges:  $Gait Training: 8-22 mins $Therapeutic Exercise: 8-22 mins $Self Care/Home Management: 8-22                    G Codes:      Sable Feil Dec 02, 2014, 10:11 AM

## 2014-11-27 NOTE — Discharge Summary (Signed)
Physician Discharge Summary  Patient ID: Andrea Mays MRN: [id number] DOB/AGE: 1951/06/13 64 y.o.  Admit date: 11/25/2014 Discharge date: 11/27/2014  Admission Diagnoses: left hip arthritis  Discharge Diagnoses: same  Active Problems:   Arthritis of left hip   Osteoarthritis of left hip   Discharged Condition: stable  Hospital Course:  Admit 2/17 LEFT THA DAY 2 2/18 TOLERATED PT WELL DAY 3 2/19 ASKED TO GO HOME, HG 11/AMBULATING WITH WALKER 60 FT , MIN ASSIST OOB        Discharge Exam: Blood pressure 116/66, pulse 110, temperature 99 F (37.2 C), temperature source Oral, resp. rate 16, height 4\' 8"  (1.422 m), weight 107 lb 12.9 oz (48.9 kg), SpO2 99 %. Incision/Wound: DRY DRESSING NO S/S OF INFECTION, CALF SOFT NO EDEMA   Disposition: Final discharge disposition not confirmed  Discharge Instructions    Call MD / Call 911    Complete by:  As directed   If you experience chest pain or shortness of breath, CALL 911 and be transported to the hospital emergency room.  If you develope a fever above 101 F, pus (white drainage) or increased drainage or redness at the wound, or calf pain, call your surgeon's office.     Change dressing    Complete by:  As directed   change your dressing on as needed     Constipation Prevention    Complete by:  As directed   Drink plenty of fluids.  Prune juice may be helpful.  You may use a stool softener, such as Colace (over the counter) 100 mg twice a day.  Use MiraLax (over the counter) for constipation as needed.     Diet - low sodium heart healthy    Complete by:  As directed      Follow the hip precautions as taught in Physical Therapy    Complete by:  As directed      Increase activity slowly as tolerated    Complete by:  As directed      TED hose    Complete by:  As directed   Use stockings (TED hose) for 2 weeks on both leg(s).  You may remove them at night for sleeping.            Medication List    TAKE these  medications        aspirin 325 MG EC tablet  Take 1 tablet (325 mg total) by mouth 2 (two) times daily.     bisacodyl 5 MG EC tablet  Commonly known as:  DULCOLAX  Take 1 tablet (5 mg total) by mouth daily as needed for moderate constipation.     CALTRATE 600+D PO  Take 1 tablet by mouth daily.     CENTRUM SILVER PO  Take 1 tablet by mouth daily.     FISH OIL PO  Take 1 capsule by mouth daily.     HYDROcodone-acetaminophen 5-325 MG per tablet  Commonly known as:  NORCO/VICODIN  Take 1 tablet by mouth every 4 (four) hours.           Follow-up Information    Follow up with Arther Abbott, MD.   Specialties:  Orthopedic Surgery, Radiology   Contact information:   [address] [place] [address] [phone]       Signed: Arther Abbott 11/27/2014, 8:57 AM

## 2014-11-27 NOTE — Progress Notes (Signed)
Patient was discharged home today with husband.  Patient was given discharge instructions, prescriptions, and care notes.  Patient verbalized understanding with no complaints or concerns voiced at this time.  IV was removed with catheter intact, no bleeding or complications.  Dressing was changed prior to discharge.  Patient left unit in stable condition by staff member in a wheelchair.

## 2014-11-28 DIAGNOSIS — Z96642 Presence of left artificial hip joint: Secondary | ICD-10-CM | POA: Diagnosis not present

## 2014-11-28 DIAGNOSIS — Z471 Aftercare following joint replacement surgery: Secondary | ICD-10-CM | POA: Diagnosis not present

## 2014-11-28 LAB — TYPE AND SCREEN
ABO/RH(D): B POS
Antibody Screen: NEGATIVE
UNIT DIVISION: 0
Unit division: 0

## 2014-11-30 DIAGNOSIS — Z471 Aftercare following joint replacement surgery: Secondary | ICD-10-CM | POA: Diagnosis not present

## 2014-11-30 DIAGNOSIS — Z96642 Presence of left artificial hip joint: Secondary | ICD-10-CM | POA: Diagnosis not present

## 2014-12-02 DIAGNOSIS — Z471 Aftercare following joint replacement surgery: Secondary | ICD-10-CM | POA: Diagnosis not present

## 2014-12-02 DIAGNOSIS — Z96642 Presence of left artificial hip joint: Secondary | ICD-10-CM | POA: Diagnosis not present

## 2014-12-04 DIAGNOSIS — Z471 Aftercare following joint replacement surgery: Secondary | ICD-10-CM | POA: Diagnosis not present

## 2014-12-04 DIAGNOSIS — Z96642 Presence of left artificial hip joint: Secondary | ICD-10-CM | POA: Diagnosis not present

## 2014-12-08 DIAGNOSIS — Z471 Aftercare following joint replacement surgery: Secondary | ICD-10-CM | POA: Diagnosis not present

## 2014-12-08 DIAGNOSIS — Z96642 Presence of left artificial hip joint: Secondary | ICD-10-CM | POA: Diagnosis not present

## 2014-12-09 DIAGNOSIS — Z471 Aftercare following joint replacement surgery: Secondary | ICD-10-CM | POA: Diagnosis not present

## 2014-12-09 DIAGNOSIS — Z96642 Presence of left artificial hip joint: Secondary | ICD-10-CM | POA: Diagnosis not present

## 2014-12-10 ENCOUNTER — Encounter: Payer: Self-pay | Admitting: Orthopedic Surgery

## 2014-12-10 ENCOUNTER — Ambulatory Visit (INDEPENDENT_AMBULATORY_CARE_PROVIDER_SITE_OTHER): Payer: Self-pay | Admitting: Orthopedic Surgery

## 2014-12-10 VITALS — BP 124/76 | Ht <= 58 in | Wt 107.8 lb

## 2014-12-10 DIAGNOSIS — Z9660 Presence of unspecified orthopedic joint implant: Secondary | ICD-10-CM

## 2014-12-10 DIAGNOSIS — M1612 Unilateral primary osteoarthritis, left hip: Secondary | ICD-10-CM

## 2014-12-10 DIAGNOSIS — M1990 Unspecified osteoarthritis, unspecified site: Secondary | ICD-10-CM

## 2014-12-10 DIAGNOSIS — Z96649 Presence of unspecified artificial hip joint: Secondary | ICD-10-CM

## 2014-12-10 MED ORDER — HYDROCODONE-ACETAMINOPHEN 5-325 MG PO TABS: 1.0000 | ORAL_TABLET | Freq: Four times a day (QID) | ORAL | Status: DC | PRN

## 2014-12-10 NOTE — Progress Notes (Signed)
Patient ID: Andrea Mays, female   DOB: 1951/02/22, 64 y.o.   MRN: [id number]  Chief Complaint  Patient presents with  . Follow-up    post op 1 Left THA, DOS 11/25/14    HPI [name] is a 64 y.o. female.  Who's here for her follow-up visit postop visit #1 Status post left total hip total hip arthroplasty  This is postoperative #1. Staples are removed today in the incision is clean dry and intact. Steri-Strips are applied and can be removed in 5 days after which time the patient may take a shower.  The patient's pain is controlled with hydrocodone 5 mg  The patient is on aspirin for DVT prophylaxis  Ambulatory assistance with a walker  Home physical therapy  Plan follow-up in 6 weeks  Start outpatient physical therapy once home physical therapy has been completed.   the notes indicate that she is walking 300 feet.

## 2014-12-11 DIAGNOSIS — Z96642 Presence of left artificial hip joint: Secondary | ICD-10-CM | POA: Diagnosis not present

## 2014-12-11 DIAGNOSIS — Z471 Aftercare following joint replacement surgery: Secondary | ICD-10-CM | POA: Diagnosis not present

## 2014-12-14 DIAGNOSIS — Z96642 Presence of left artificial hip joint: Secondary | ICD-10-CM | POA: Diagnosis not present

## 2014-12-14 DIAGNOSIS — Z471 Aftercare following joint replacement surgery: Secondary | ICD-10-CM | POA: Diagnosis not present

## 2014-12-16 DIAGNOSIS — Z471 Aftercare following joint replacement surgery: Secondary | ICD-10-CM | POA: Diagnosis not present

## 2014-12-16 DIAGNOSIS — Z96642 Presence of left artificial hip joint: Secondary | ICD-10-CM | POA: Diagnosis not present

## 2015-01-21 ENCOUNTER — Ambulatory Visit (INDEPENDENT_AMBULATORY_CARE_PROVIDER_SITE_OTHER): Payer: Medicare Other | Admitting: Orthopedic Surgery

## 2015-01-21 ENCOUNTER — Encounter: Payer: Self-pay | Admitting: Orthopedic Surgery

## 2015-01-21 VITALS — BP 143/88 | Ht <= 58 in | Wt 107.8 lb

## 2015-01-21 DIAGNOSIS — Z9660 Presence of unspecified orthopedic joint implant: Secondary | ICD-10-CM | POA: Diagnosis not present

## 2015-01-21 DIAGNOSIS — Z96649 Presence of unspecified artificial hip joint: Secondary | ICD-10-CM

## 2015-01-21 DIAGNOSIS — M5440 Lumbago with sciatica, unspecified side: Secondary | ICD-10-CM

## 2015-01-21 MED ORDER — PREDNISONE (PAK) 5 MG PO TABS: ORAL_TABLET | ORAL | Status: DC

## 2015-01-21 NOTE — Patient Instructions (Signed)
Medication sent to pharmacy  

## 2015-01-21 NOTE — Progress Notes (Signed)
New problem in the postop period  Chief Complaint  Patient presents with  . Follow-up    6 week follow up Left THA, DOS 11/25/14    In the course of the routine follow-up status post total hip replacement approximately 2 months ago the patient has noticed bilateral leg pain especially at night with pain in the left side of her lower back no trauma  Bilateral leg pain from the knees down no numbness or tingling  Stable as recorded appearance normal well-groomed she has mild tenderness in the right knee no tenderness in the left knee full range of motion bilaterally in both knees with no pain both hips are stable without pain with range of motion test. Her incision looks clean dry and intact  She has tenderness in her lower back with negative straight leg raises bilaterally  Neurologic exam is intact  Impression back pain after total hip with bilateral leg pain  Recommend prednisone Dosepak follow-up in a month

## 2015-02-16 ENCOUNTER — Encounter: Payer: Self-pay | Admitting: Orthopedic Surgery

## 2015-02-16 ENCOUNTER — Ambulatory Visit (INDEPENDENT_AMBULATORY_CARE_PROVIDER_SITE_OTHER): Payer: Medicare Other | Admitting: Orthopedic Surgery

## 2015-02-16 VITALS — BP 123/74 | Ht <= 58 in | Wt 107.8 lb

## 2015-02-16 DIAGNOSIS — Z9660 Presence of unspecified orthopedic joint implant: Secondary | ICD-10-CM | POA: Diagnosis not present

## 2015-02-16 DIAGNOSIS — M5440 Lumbago with sciatica, unspecified side: Secondary | ICD-10-CM | POA: Diagnosis not present

## 2015-02-16 DIAGNOSIS — Z96649 Presence of unspecified artificial hip joint: Secondary | ICD-10-CM

## 2015-02-16 MED ORDER — TRAMADOL-ACETAMINOPHEN 37.5-325 MG PO TABS: 1.0000 | ORAL_TABLET | ORAL | Status: DC | PRN

## 2015-02-16 NOTE — Progress Notes (Signed)
Patient ID: Andrea Mays, female   DOB: August 26, 1951, 64 y.o.   MRN: [id number] Chief Complaint  Patient presents with  . Follow-up    4 week follow up back and leg pain (11-25-14-tha left hip)    Follow-up visit within the postoperative period for this 64 year old female had a successful left total hip arthroplasty but developed postop radicular pain in her left leg and we treated her medically and she has improved she is now without complaint  She has a little bit of tenderness in the left SI joint no midline tenderness her right SI joint tenderness and painless range of motion in the left hip walking without a cane or crutch. She would like some medication if her legs hurt which they do sometimes when she does a lot of walking. This is unrelated to her hip and may be some early spinal stenosis  Carotid ultrasound  Follow-up 3 months routine for her 6 month postop visit from her total hip

## 2015-02-16 NOTE — Patient Instructions (Signed)
Activities as tolerated. 

## 2015-05-20 ENCOUNTER — Encounter: Payer: Self-pay | Admitting: Orthopedic Surgery

## 2015-05-20 ENCOUNTER — Ambulatory Visit (INDEPENDENT_AMBULATORY_CARE_PROVIDER_SITE_OTHER): Payer: Medicare Other | Admitting: Orthopedic Surgery

## 2015-05-20 VITALS — BP 128/81 | Ht <= 58 in | Wt 107.0 lb

## 2015-05-20 DIAGNOSIS — Z96649 Presence of unspecified artificial hip joint: Secondary | ICD-10-CM

## 2015-05-20 DIAGNOSIS — Z9660 Presence of unspecified orthopedic joint implant: Secondary | ICD-10-CM

## 2015-05-20 NOTE — Progress Notes (Signed)
Patient ID: Andrea Mays, female   DOB: 07-10-1951, 64 y.o.   MRN: [id number]  Follow up visit  Chief Complaint  Patient presents with  . Follow-up    follow up LT THA, DOS February 2015    BP 128/81 mmHg  Ht 4\' 8"  (1.422 m)  Wt 107 lb (48.535 kg)  BMI 24.00 kg/m2  Encounter Diagnosis  Name Primary?  . S/P hip replacement Yes   Left hip replacement doing well in 6 months. She has no complaints at this time. Her leg lengths are equal she is walking without support she does not have a limp she's doing well we'll see her other annual follow-up for AP pelvis and lateral of the left hip

## 2015-08-02 ENCOUNTER — Other Ambulatory Visit: Payer: Self-pay | Admitting: Family Medicine

## 2015-08-02 DIAGNOSIS — Z1231 Encounter for screening mammogram for malignant neoplasm of breast: Secondary | ICD-10-CM

## 2015-08-09 ENCOUNTER — Encounter: Payer: Self-pay | Admitting: Family Medicine

## 2015-08-09 ENCOUNTER — Ambulatory Visit (INDEPENDENT_AMBULATORY_CARE_PROVIDER_SITE_OTHER): Payer: Medicare Other | Admitting: Family Medicine

## 2015-08-09 VITALS — BP 130/82 | Ht <= 58 in | Wt 105.8 lb

## 2015-08-09 DIAGNOSIS — M25549 Pain in joints of unspecified hand: Secondary | ICD-10-CM

## 2015-08-09 DIAGNOSIS — E785 Hyperlipidemia, unspecified: Secondary | ICD-10-CM | POA: Diagnosis not present

## 2015-08-09 DIAGNOSIS — Z23 Encounter for immunization: Secondary | ICD-10-CM

## 2015-08-09 DIAGNOSIS — Z79899 Other long term (current) drug therapy: Secondary | ICD-10-CM

## 2015-08-09 NOTE — Progress Notes (Signed)
   Subjective:    Patient ID: Andrea Mays, female    DOB: 06-18-1951, 64 y.o.   MRN: [id number]  Hyperlipidemia This is a chronic problem. The current episode started more than 1 year ago. Pertinent negatives include no chest pain. Current antihyperlipidemic treatment includes diet change (omega fish oil). There are no compliance problems.  Risk factors for coronary artery disease include dyslipidemia.   PMH benign Patient relates some arthritis in her hands. States overall she is doing well. Patient with no rectal bleeding no weight change  Review of Systems  Constitutional: Negative for activity change, appetite change and fatigue.  HENT: Negative for congestion.   Respiratory: Negative for cough.   Cardiovascular: Negative for chest pain.  Gastrointestinal: Negative for abdominal pain.  Endocrine: Negative for polydipsia and polyphagia.  Musculoskeletal: Positive for arthralgias.  Neurological: Negative for weakness.  Psychiatric/Behavioral: Negative for confusion.       Objective:   Physical Exam  Constitutional: She appears well-nourished. No distress.  Cardiovascular: Normal rate, regular rhythm and normal heart sounds.   No murmur heard. Pulmonary/Chest: Effort normal and breath sounds normal. No respiratory distress.  Musculoskeletal: She exhibits no edema.  Lymphadenopathy:    She has no cervical adenopathy.  Neurological: She is alert. She exhibits normal muscle tone.  Psychiatric: Her behavior is normal.  Vitals reviewed.         Assessment & Plan:  Hyperlipidemia-check lipid profile. Regular healthy diet recommended physical activity.  Patient does use ibuprofen intermittently for her arthritis in her hands check rheumatoid factor patient does not want to go to a rheumatologist  Do to high risk medication use check metabolic profile make sure kidneys are getting along with anti-inflammatories  Patient thinks that she is not due for colonoscopy until 5  more years but according to my records she seems to be due. We will see what gastroenterology has to say

## 2015-08-10 DIAGNOSIS — H04123 Dry eye syndrome of bilateral lacrimal glands: Secondary | ICD-10-CM | POA: Diagnosis not present

## 2015-08-10 DIAGNOSIS — H2513 Age-related nuclear cataract, bilateral: Secondary | ICD-10-CM | POA: Diagnosis not present

## 2015-08-10 LAB — BASIC METABOLIC PANEL
BUN / CREAT RATIO: 18 (ref 11–26)
BUN: 12 mg/dL (ref 8–27)
CALCIUM: 9.6 mg/dL (ref 8.7–10.3)
CO2: 24 mmol/L (ref 18–29)
Chloride: 100 mmol/L (ref 97–106)
Creatinine, Ser: 0.68 mg/dL (ref 0.57–1.00)
GFR calc Af Amer: 106 mL/min/{1.73_m2} (ref >59)
GFR, EST NON AFRICAN AMERICAN: 92 mL/min/{1.73_m2} (ref >59)
GLUCOSE: 89 mg/dL (ref 65–99)
Potassium: 5.1 mmol/L (ref 3.5–5.2)
SODIUM: 139 mmol/L (ref 136–144)

## 2015-08-10 LAB — LIPID PANEL
CHOL/HDL RATIO: 4.4 ratio (ref 0.0–4.4)
Cholesterol, Total: 257 mg/dL — ABNORMAL HIGH (ref 100–199)
HDL: 59 mg/dL (ref >39)
LDL CALC: 156 mg/dL — AB (ref 0–99)
Triglycerides: 212 mg/dL — ABNORMAL HIGH (ref 0–149)
VLDL Cholesterol Cal: 42 mg/dL — ABNORMAL HIGH (ref 5–40)

## 2015-08-10 LAB — RHEUMATOID FACTOR: Rhuematoid fact SerPl-aCnc: 18.8 IU/mL — ABNORMAL HIGH (ref 0.0–13.9)

## 2015-08-12 ENCOUNTER — Ambulatory Visit (HOSPITAL_COMMUNITY): Payer: Medicare Other

## 2015-08-18 ENCOUNTER — Other Ambulatory Visit: Payer: Self-pay

## 2015-08-18 DIAGNOSIS — R768 Other specified abnormal immunological findings in serum: Secondary | ICD-10-CM

## 2015-08-19 ENCOUNTER — Ambulatory Visit (HOSPITAL_COMMUNITY)
Admission: RE | Admit: 2015-08-19 | Discharge: 2015-08-19 | Disposition: A | Discharge status: Home / Self Care | Payer: Medicare Other | Source: Ambulatory Visit | Attending: Family Medicine | Admitting: Family Medicine

## 2015-08-19 DIAGNOSIS — Z1231 Encounter for screening mammogram for malignant neoplasm of breast: Secondary | ICD-10-CM | POA: Insufficient documentation

## 2015-08-21 ENCOUNTER — Encounter: Payer: Self-pay | Admitting: Family Medicine

## 2015-11-18 ENCOUNTER — Ambulatory Visit: Payer: Medicare Other | Admitting: Orthopedic Surgery

## 2015-11-23 ENCOUNTER — Ambulatory Visit (INDEPENDENT_AMBULATORY_CARE_PROVIDER_SITE_OTHER): Payer: Medicare Other

## 2015-11-23 ENCOUNTER — Ambulatory Visit: Payer: Medicare Other

## 2015-11-23 ENCOUNTER — Ambulatory Visit (INDEPENDENT_AMBULATORY_CARE_PROVIDER_SITE_OTHER): Payer: Medicare Other | Admitting: Orthopedic Surgery

## 2015-11-23 ENCOUNTER — Encounter: Payer: Self-pay | Admitting: Orthopedic Surgery

## 2015-11-23 VITALS — BP 135/83 | Ht <= 58 in | Wt 108.0 lb

## 2015-11-23 DIAGNOSIS — Z96642 Presence of left artificial hip joint: Secondary | ICD-10-CM

## 2015-11-23 DIAGNOSIS — M1711 Unilateral primary osteoarthritis, right knee: Secondary | ICD-10-CM

## 2015-11-23 DIAGNOSIS — M25561 Pain in right knee: Secondary | ICD-10-CM

## 2015-11-23 DIAGNOSIS — M129 Arthropathy, unspecified: Secondary | ICD-10-CM | POA: Diagnosis not present

## 2015-11-23 MED ORDER — DICLOFENAC POTASSIUM 50 MG PO TABS: 50.0000 mg | ORAL_TABLET | Freq: Two times a day (BID) | ORAL | Status: DC

## 2015-11-23 NOTE — Progress Notes (Signed)
Patient ID: Andrea Mays, female   DOB: [birth year], 65 y.o.   MRN: [id number]  Chief Complaint  Patient presents with  . Follow-up    FOLLOW UP + XRAY LEFT THA,DOS 11/25/14    HPI Andrea Mays is a 65 y.o. female.   S/p left tha no problems there  C/o new onset atraumatic pain right knee exacerbated by standing walking for 3-4 hours. Associated with popping mild catching no locking or giving way. No swelling    Review of Systems Review of Systems  Constitutional: Negative for fever and chills.  Respiratory: Negative for chest tightness.   Cardiovascular: Negative for leg swelling.  Musculoskeletal: Positive for arthralgias. Negative for back pain, joint swelling and gait problem.  Neurological: Negative for numbness.     Past Medical History  Diagnosis Date  . No abnormality seen     Past Surgical History  Procedure Laterality Date  . Total hip arthroplasty Left 11/25/2014    Procedure: LEFT TOTAL HIP ARTHROPLASTY;  Surgeon: Carole Civil, MD;  Location: [facility];  Service: Orthopedics;  Laterality: Left;      Physical Exam 1 Blood pressure 135/83, height 4\' 8"  (1.422 m), weight 108 lb (48.988 kg). Physical Exam 2 The patient is well developed well nourished and well groomed. 3 Orientation to person place and time is normal  4 Mood is pleasant.  5 Ambulatory status normal no assist devices   6 Inspection of the right knee medial tenderness 7 Range of motion assessment 0-130 8 Stability tests are normal  9 Strength assessment normal grade 5/5 quad  10 Nerve function normal sensation 11 Vascular function normal pulse in both feet no peripheral edema 12 Local lymphatic system normalgroin  Opposite extremity left hip there is no alignment abnormality, no contracture, no subluxation, no atrophy and neurovascular exam is intact  Data Reviewed X-rays I've independently interpreted the x-ray as follows  Right knee osteoarthritis  Assessment    Mild to  moderate arthritis right knee  Recommend injection patient declined patient will be treated with oral medication will start oral NSAID   Plan    Follow-up x-ray left hip   Start diclofenac 50 mg once a day

## 2016-02-23 ENCOUNTER — Encounter: Payer: Self-pay | Admitting: Family Medicine

## 2016-06-05 ENCOUNTER — Ambulatory Visit (INDEPENDENT_AMBULATORY_CARE_PROVIDER_SITE_OTHER): Payer: Medicare Other | Admitting: Family Medicine

## 2016-06-05 ENCOUNTER — Encounter: Payer: Self-pay | Admitting: Family Medicine

## 2016-06-05 VITALS — BP 132/80 | Ht <= 58 in | Wt 101.5 lb

## 2016-06-05 DIAGNOSIS — E785 Hyperlipidemia, unspecified: Secondary | ICD-10-CM

## 2016-06-05 DIAGNOSIS — Z139 Encounter for screening, unspecified: Secondary | ICD-10-CM

## 2016-06-05 DIAGNOSIS — M1990 Unspecified osteoarthritis, unspecified site: Secondary | ICD-10-CM

## 2016-06-05 MED ORDER — AZITHROMYCIN 250 MG PO TABS: ORAL_TABLET | ORAL | 0 refills | Status: DC

## 2016-06-05 NOTE — Progress Notes (Signed)
   Subjective:    Patient ID: Andrea Mays, female    DOB: [birth year], 65 y.o.   MRN: [id number]  Hyperlipidemia  This is a chronic problem. The current episode started more than 1 year ago. There are no compliance problems.    She denies high fever chills sweats. She does relate some coughing congestion. This been present for 2 weeks. Patient states no other concerns this visit.  Review of Systems She relates a lot of joint pains discomforts otherwise she states she's feeling good    Objective:   Physical Exam Lungs clear heart regular severe arthritis noted in the hands and knees       Assessment & Plan:  I recommend referral to rheumatology patient did not go with the last referral this referral is given again. Lab work pending. This patient would benefit from rheumatology I believe she has rheumatoid arthritis  Z-Pak as directed loratadine also for the cough I find no evidence of pneumonia of ongoing troubles x-rays lab

## 2016-06-06 ENCOUNTER — Encounter: Payer: Self-pay | Admitting: Family Medicine

## 2016-06-06 LAB — BASIC METABOLIC PANEL
BUN/Creatinine Ratio: 13 (ref 12–28)
BUN: 8 mg/dL (ref 8–27)
CALCIUM: 9.2 mg/dL (ref 8.7–10.3)
CO2: 22 mmol/L (ref 18–29)
CREATININE: 0.62 mg/dL (ref 0.57–1.00)
Chloride: 96 mmol/L (ref 96–106)
GFR, EST AFRICAN AMERICAN: 109 mL/min/{1.73_m2} (ref >59)
GFR, EST NON AFRICAN AMERICAN: 94 mL/min/{1.73_m2} (ref >59)
Glucose: 78 mg/dL (ref 65–99)
POTASSIUM: 5.1 mmol/L (ref 3.5–5.2)
Sodium: 134 mmol/L (ref 134–144)

## 2016-06-06 LAB — SEDIMENTATION RATE: Sed Rate: 31 mm/hr (ref 0–40)

## 2016-06-06 LAB — LIPID PANEL
CHOLESTEROL TOTAL: 247 mg/dL — AB (ref 100–199)
Chol/HDL Ratio: 4.5 ratio units — ABNORMAL HIGH (ref 0.0–4.4)
HDL: 55 mg/dL (ref >39)
LDL CALC: 135 mg/dL — AB (ref 0–99)
TRIGLYCERIDES: 286 mg/dL — AB (ref 0–149)
VLDL Cholesterol Cal: 57 mg/dL — ABNORMAL HIGH (ref 5–40)

## 2016-06-08 ENCOUNTER — Encounter: Payer: Self-pay | Admitting: Family Medicine

## 2016-07-17 ENCOUNTER — Other Ambulatory Visit: Payer: Self-pay | Admitting: Family Medicine

## 2016-07-17 DIAGNOSIS — Z1231 Encounter for screening mammogram for malignant neoplasm of breast: Secondary | ICD-10-CM

## 2016-08-07 DIAGNOSIS — M150 Primary generalized (osteo)arthritis: Secondary | ICD-10-CM | POA: Diagnosis not present

## 2016-08-07 DIAGNOSIS — M7989 Other specified soft tissue disorders: Secondary | ICD-10-CM | POA: Diagnosis not present

## 2016-08-07 DIAGNOSIS — M2550 Pain in unspecified joint: Secondary | ICD-10-CM | POA: Diagnosis not present

## 2016-08-11 ENCOUNTER — Ambulatory Visit (INDEPENDENT_AMBULATORY_CARE_PROVIDER_SITE_OTHER): Payer: Medicare Other

## 2016-08-11 ENCOUNTER — Telehealth: Payer: Self-pay | Admitting: Family Medicine

## 2016-08-11 DIAGNOSIS — Z23 Encounter for immunization: Secondary | ICD-10-CM

## 2016-08-11 NOTE — Telephone Encounter (Signed)
Review lab results faxed over from [facility].

## 2016-08-13 NOTE — Telephone Encounter (Signed)
The results of lab work showed no anemia sedimentation rate look good C reactive protein liver functions all looked good. These were ordered by her rheumatologist. She has Arty been told to follow-up within 6 months of her previous visit.

## 2016-08-14 ENCOUNTER — Telehealth: Payer: Self-pay | Admitting: Family Medicine

## 2016-08-14 DIAGNOSIS — R7301 Impaired fasting glucose: Secondary | ICD-10-CM

## 2016-08-14 DIAGNOSIS — E785 Hyperlipidemia, unspecified: Secondary | ICD-10-CM

## 2016-08-14 NOTE — Telephone Encounter (Signed)
The patient should follow-up proximally 6 months from the last visit which was at the end of August so around the end of February. I recommend lipid liver glucose fasting before her next visit no need to do this blood work until February

## 2016-08-14 NOTE — Telephone Encounter (Signed)
Pt is needing to know if she will need blood work done before her next follow up appt. Husband was given blood work and thought they were for her. Please advise.

## 2016-08-15 NOTE — Telephone Encounter (Signed)
Spoke with patient's husband  and informed him per Dr.Scott Luking- The patient should follow-up proximally 6 months from the last visit which was at the end of August so around the end of February. I recommend lipid liver glucose fasting before her next visit no need to do this blood work until February. Patient's husband verbalized understanding.

## 2016-08-21 ENCOUNTER — Ambulatory Visit (HOSPITAL_COMMUNITY)
Admission: RE | Admit: 2016-08-21 | Discharge: 2016-08-21 | Disposition: A | Discharge status: Home / Self Care | Payer: Medicare Other | Source: Ambulatory Visit | Attending: Family Medicine | Admitting: Family Medicine

## 2016-08-21 DIAGNOSIS — Z1231 Encounter for screening mammogram for malignant neoplasm of breast: Secondary | ICD-10-CM | POA: Insufficient documentation

## 2016-08-23 DIAGNOSIS — H269 Unspecified cataract: Secondary | ICD-10-CM | POA: Diagnosis not present

## 2016-11-30 ENCOUNTER — Telehealth: Payer: Self-pay | Admitting: Family Medicine

## 2016-11-30 DIAGNOSIS — Z131 Encounter for screening for diabetes mellitus: Secondary | ICD-10-CM

## 2016-11-30 DIAGNOSIS — E785 Hyperlipidemia, unspecified: Secondary | ICD-10-CM

## 2016-11-30 DIAGNOSIS — Z79899 Other long term (current) drug therapy: Secondary | ICD-10-CM

## 2016-11-30 NOTE — Telephone Encounter (Signed)
Lipid, liver, glucose 

## 2016-11-30 NOTE — Telephone Encounter (Signed)
Orders in. Left message to return call

## 2016-11-30 NOTE — Telephone Encounter (Signed)
Pt.notified

## 2016-11-30 NOTE — Telephone Encounter (Signed)
Wanting to know if blood work is due for appointment on 12/07/16 with Dr. Nicki Reaper.

## 2016-11-30 NOTE — Telephone Encounter (Signed)
Patient had lipid, liver and glucose 08/2016

## 2016-12-01 DIAGNOSIS — Z79899 Other long term (current) drug therapy: Secondary | ICD-10-CM | POA: Diagnosis not present

## 2016-12-01 DIAGNOSIS — E785 Hyperlipidemia, unspecified: Secondary | ICD-10-CM | POA: Diagnosis not present

## 2016-12-01 DIAGNOSIS — Z131 Encounter for screening for diabetes mellitus: Secondary | ICD-10-CM | POA: Diagnosis not present

## 2016-12-02 LAB — LIPID PANEL
Chol/HDL Ratio: 4.7 ratio units — ABNORMAL HIGH (ref 0.0–4.4)
Cholesterol, Total: 261 mg/dL — ABNORMAL HIGH (ref 100–199)
HDL: 56 mg/dL (ref >39)
LDL Calculated: 144 mg/dL — ABNORMAL HIGH (ref 0–99)
TRIGLYCERIDES: 304 mg/dL — AB (ref 0–149)
VLDL Cholesterol Cal: 61 mg/dL — ABNORMAL HIGH (ref 5–40)

## 2016-12-02 LAB — HEPATIC FUNCTION PANEL
ALBUMIN: 4.2 g/dL (ref 3.6–4.8)
ALT: 34 IU/L — AB (ref 0–32)
AST: 27 IU/L (ref 0–40)
Alkaline Phosphatase: 108 IU/L (ref 39–117)
BILIRUBIN TOTAL: 0.4 mg/dL (ref 0.0–1.2)
BILIRUBIN, DIRECT: 0.11 mg/dL (ref 0.00–0.40)
Total Protein: 7.1 g/dL (ref 6.0–8.5)

## 2016-12-02 LAB — GLUCOSE, RANDOM: Glucose: 87 mg/dL (ref 65–99)

## 2016-12-04 ENCOUNTER — Ambulatory Visit (INDEPENDENT_AMBULATORY_CARE_PROVIDER_SITE_OTHER): Payer: PPO

## 2016-12-04 ENCOUNTER — Ambulatory Visit (INDEPENDENT_AMBULATORY_CARE_PROVIDER_SITE_OTHER): Payer: PPO | Admitting: Orthopedic Surgery

## 2016-12-04 VITALS — BP 132/82 | HR 90 | Ht <= 58 in | Wt 103.0 lb

## 2016-12-04 DIAGNOSIS — Z96642 Presence of left artificial hip joint: Secondary | ICD-10-CM | POA: Diagnosis not present

## 2016-12-04 DIAGNOSIS — M1610 Unilateral primary osteoarthritis, unspecified hip: Secondary | ICD-10-CM | POA: Diagnosis not present

## 2016-12-04 NOTE — Progress Notes (Signed)
Patient ID: Andrea Mays, female   DOB: [birth year], 66 y.o.   MRN: [id number]  Chief Complaint  Patient presents with  . Follow-up    Yearly recheck on left hip replacement, DOS 11-25-14.    HPI Andrea Mays is a 66 y.o. female.   Status post  total hip arthroplasty. The patient is doing well the hip implant is functioning well.  Review of Systems Review of Systems  Constitutional symptoms no fever or chills Neurologic symptoms no numbness or tingling  Past Medical History:  Diagnosis Date  . No abnormality seen     Past Surgical History:  Procedure Laterality Date  . TOTAL HIP ARTHROPLASTY Left 11/25/2014   Procedure: LEFT TOTAL HIP ARTHROPLASTY;  Surgeon: Carole Civil, MD;  Location: [facility];  Service: Orthopedics;  Laterality: Left;     No Known Allergies  Current Outpatient Prescriptions  Medication Sig Dispense Refill  . aspirin 81 MG tablet Take 81 mg by mouth daily.    Marland Kitchen azithromycin (ZITHROMAX Z-PAK) 250 MG tablet Take 2 tablets (500 mg) on  Day 1,  followed by 1 tablet (250 mg) once daily on Days 2 through 5. 6 each 0  . Calcium Carbonate-Vitamin D (CALTRATE 600+D PO) Take by mouth.    . diclofenac (CATAFLAM) 50 MG tablet Take 1 tablet (50 mg total) by mouth 2 (two) times daily. 60 tablet 5  . Multiple Vitamins-Minerals (CENTRUM SILVER PO) Take 1 tablet by mouth daily.    . Omega-3 Fatty Acids (FISH OIL PO) Take 1 capsule by mouth daily.     No current facility-administered medications for this visit.      Physical Exam Blood pressure 132/82, pulse 90, height 4\' 6"  (1.372 m), weight 103 lb (46.7 kg).  Overall appearance normal grooming and hygiene normal. The patient is awake alert and oriented 3 with a pleasant mood and affect. The patient is ambulatory without assistive device and without limping.  LEFT HIP: The skin incision is healed without any erythema or tenderness or neuroma. Hip flexion strength grade 5. Hip is stable to post pull and  abduction stress test. Hip flexion is 120. There is no tenderness or swelling around the hip. Distal pulses are intact sensation is normal and there is no lymphadenopathy in the groin patient walks with normal balance and coordination    Data Reviewed Today's imaging shows stable LEFT total hip implant without loosening or complication  Assessment Status post LEFT total hip, stable   Plan Routine repeat x-ray right hip in one year  10:36 AM Arther Abbott, MD 12/04/2016

## 2016-12-07 ENCOUNTER — Encounter: Payer: Self-pay | Admitting: Family Medicine

## 2016-12-07 ENCOUNTER — Ambulatory Visit (INDEPENDENT_AMBULATORY_CARE_PROVIDER_SITE_OTHER): Payer: PPO | Admitting: Family Medicine

## 2016-12-07 VITALS — BP 128/86 | Ht <= 58 in | Wt 106.0 lb

## 2016-12-07 DIAGNOSIS — Z23 Encounter for immunization: Secondary | ICD-10-CM

## 2016-12-07 DIAGNOSIS — Z780 Asymptomatic menopausal state: Secondary | ICD-10-CM | POA: Diagnosis not present

## 2016-12-07 DIAGNOSIS — M25549 Pain in joints of unspecified hand: Secondary | ICD-10-CM | POA: Diagnosis not present

## 2016-12-07 DIAGNOSIS — E784 Other hyperlipidemia: Secondary | ICD-10-CM | POA: Diagnosis not present

## 2016-12-07 DIAGNOSIS — E7849 Other hyperlipidemia: Secondary | ICD-10-CM

## 2016-12-07 DIAGNOSIS — Z79899 Other long term (current) drug therapy: Secondary | ICD-10-CM

## 2016-12-07 DIAGNOSIS — M8580 Other specified disorders of bone density and structure, unspecified site: Secondary | ICD-10-CM | POA: Diagnosis not present

## 2016-12-07 MED ORDER — PRAVASTATIN SODIUM 20 MG PO TABS: 20.0000 mg | ORAL_TABLET | Freq: Every day | ORAL | 5 refills | Status: DC

## 2016-12-07 NOTE — Patient Instructions (Signed)
Results for orders placed or performed in visit on 11/30/16  Lipid panel  Result Value Ref Range   Cholesterol, Total 261 (H) 100 - 199 mg/dL   Triglycerides 304 (H) 0 - 149 mg/dL   HDL 56 >39 mg/dL   VLDL Cholesterol Cal 61 (H) 5 - 40 mg/dL   LDL Calculated 144 (H) 0 - 99 mg/dL   Chol/HDL Ratio 4.7 (H) 0.0 - 4.4 ratio units  Hepatic function panel  Result Value Ref Range   Total Protein 7.1 6.0 - 8.5 g/dL   Albumin 4.2 3.6 - 4.8 g/dL   Bilirubin Total 0.4 0.0 - 1.2 mg/dL   Bilirubin, Direct 0.11 0.00 - 0.40 mg/dL   Alkaline Phosphatase 108 39 - 117 IU/L   AST 27 0 - 40 IU/L   ALT 34 (H) 0 - 32 IU/L  Glucose, random  Result Value Ref Range   Glucose 87 65 - 99 mg/dL

## 2016-12-07 NOTE — Progress Notes (Addendum)
   Subjective:    Patient ID: Andrea Mays, female    DOB: 1950/12/07, 66 y.o.   MRN: [id number]  Hyperlipidemia  This is a chronic problem. There are no compliance problems (eats healthy, exercises).   This patient states overall she is doing pretty well she still has osteoarthritis in her hands. She does try to watch her diet she does stay as active as she possibly can she denies any chest tightness pressure pain shortness of breath. He made benign. No concerns today.    Review of Systems Please see above.    Objective:   Physical Exam Lungs clear heart rate or pulse normal osteoarthritis is noted       Assessment & Plan:  Osteoarthritis anti-inflammatory when necessary has seen rheumatology Hyperlipidemia labs reviewed patient needs medication we will go ahead and start pravastatin 20 mg daily she will check lipid liver in approximately 8 weeks Patient will follow-up here in 6 months Pneumonia vaccine today

## 2016-12-13 ENCOUNTER — Ambulatory Visit (HOSPITAL_COMMUNITY)
Admission: RE | Admit: 2016-12-13 | Discharge: 2016-12-13 | Disposition: A | Discharge status: Home / Self Care | Payer: PPO | Source: Ambulatory Visit | Attending: Family Medicine | Admitting: Family Medicine

## 2016-12-13 ENCOUNTER — Encounter: Payer: Self-pay | Admitting: Family Medicine

## 2016-12-13 DIAGNOSIS — M8588 Other specified disorders of bone density and structure, other site: Secondary | ICD-10-CM | POA: Diagnosis not present

## 2016-12-13 DIAGNOSIS — Z780 Asymptomatic menopausal state: Secondary | ICD-10-CM | POA: Insufficient documentation

## 2016-12-13 DIAGNOSIS — M8589 Other specified disorders of bone density and structure, multiple sites: Secondary | ICD-10-CM | POA: Diagnosis not present

## 2016-12-13 DIAGNOSIS — M85851 Other specified disorders of bone density and structure, right thigh: Secondary | ICD-10-CM | POA: Diagnosis not present

## 2016-12-13 DIAGNOSIS — M8580 Other specified disorders of bone density and structure, unspecified site: Secondary | ICD-10-CM | POA: Diagnosis not present

## 2017-02-22 DIAGNOSIS — E784 Other hyperlipidemia: Secondary | ICD-10-CM | POA: Diagnosis not present

## 2017-02-22 DIAGNOSIS — Z79899 Other long term (current) drug therapy: Secondary | ICD-10-CM | POA: Diagnosis not present

## 2017-02-23 LAB — LIPID PANEL
CHOL/HDL RATIO: 4.1 ratio (ref 0.0–4.4)
Cholesterol, Total: 212 mg/dL — ABNORMAL HIGH (ref 100–199)
HDL: 52 mg/dL (ref >39)
LDL Calculated: 112 mg/dL — ABNORMAL HIGH (ref 0–99)
TRIGLYCERIDES: 241 mg/dL — AB (ref 0–149)
VLDL CHOLESTEROL CAL: 48 mg/dL — AB (ref 5–40)

## 2017-03-01 ENCOUNTER — Telehealth: Payer: Self-pay | Admitting: Family Medicine

## 2017-03-01 MED ORDER — PRAVASTATIN SODIUM 40 MG PO TABS: 40.0000 mg | ORAL_TABLET | Freq: Every day | ORAL | 5 refills | Status: DC

## 2017-03-01 NOTE — Telephone Encounter (Signed)
Medication sent into pharmacy  

## 2017-03-01 NOTE — Telephone Encounter (Signed)
Patients spouse says he spoke with someone 02/27/17 and received the results to Trina's blood work.  He came in the office inquiring about the medication that was supposed to be called in.  I explained to him that there is not a DPR on file to be able to discuss her medical information with him so he is going to ask her to sign a form to be able to speak with him in the future since she cannot speak [place].  But he is asking that the medication be called into [facility] and no one has to call him for now.

## 2017-06-05 ENCOUNTER — Telehealth: Payer: Self-pay | Admitting: Family Medicine

## 2017-06-05 DIAGNOSIS — Z131 Encounter for screening for diabetes mellitus: Secondary | ICD-10-CM

## 2017-06-05 DIAGNOSIS — Z79899 Other long term (current) drug therapy: Secondary | ICD-10-CM

## 2017-06-05 DIAGNOSIS — E7849 Other hyperlipidemia: Secondary | ICD-10-CM

## 2017-06-05 NOTE — Telephone Encounter (Signed)
Blood work ordered in EPIC. Patient notified. 

## 2017-06-05 NOTE — Telephone Encounter (Signed)
Patient had Lipid on 5/18

## 2017-06-05 NOTE — Telephone Encounter (Signed)
Patient has 6 mth follow up on 9/7 and needing labs done

## 2017-06-05 NOTE — Telephone Encounter (Signed)
Metabolic 7, lipid, liver 

## 2017-06-07 DIAGNOSIS — E784 Other hyperlipidemia: Secondary | ICD-10-CM | POA: Diagnosis not present

## 2017-06-07 DIAGNOSIS — Z79899 Other long term (current) drug therapy: Secondary | ICD-10-CM | POA: Diagnosis not present

## 2017-06-07 DIAGNOSIS — Z131 Encounter for screening for diabetes mellitus: Secondary | ICD-10-CM | POA: Diagnosis not present

## 2017-06-08 LAB — BASIC METABOLIC PANEL
BUN/Creatinine Ratio: 15 (ref 12–28)
BUN: 10 mg/dL (ref 8–27)
CO2: 22 mmol/L (ref 20–29)
CREATININE: 0.65 mg/dL (ref 0.57–1.00)
Calcium: 9.2 mg/dL (ref 8.7–10.3)
Chloride: 101 mmol/L (ref 96–106)
GFR, EST AFRICAN AMERICAN: 106 mL/min/{1.73_m2} (ref >59)
GFR, EST NON AFRICAN AMERICAN: 92 mL/min/{1.73_m2} (ref >59)
GLUCOSE: 86 mg/dL (ref 65–99)
Potassium: 4.7 mmol/L (ref 3.5–5.2)
Sodium: 136 mmol/L (ref 134–144)

## 2017-06-08 LAB — HEPATIC FUNCTION PANEL
ALT: 23 IU/L (ref 0–32)
AST: 24 IU/L (ref 0–40)
Albumin: 4.3 g/dL (ref 3.6–4.8)
Alkaline Phosphatase: 95 IU/L (ref 39–117)
Bilirubin Total: 0.3 mg/dL (ref 0.0–1.2)
Bilirubin, Direct: 0.1 mg/dL (ref 0.00–0.40)
TOTAL PROTEIN: 6.8 g/dL (ref 6.0–8.5)

## 2017-06-08 LAB — LIPID PANEL
CHOL/HDL RATIO: 3.2 ratio (ref 0.0–4.4)
Cholesterol, Total: 209 mg/dL — ABNORMAL HIGH (ref 100–199)
HDL: 66 mg/dL (ref >39)
LDL CALC: 115 mg/dL — AB (ref 0–99)
Triglycerides: 142 mg/dL (ref 0–149)
VLDL CHOLESTEROL CAL: 28 mg/dL (ref 5–40)

## 2017-06-15 ENCOUNTER — Ambulatory Visit (INDEPENDENT_AMBULATORY_CARE_PROVIDER_SITE_OTHER): Payer: PPO | Admitting: Family Medicine

## 2017-06-15 ENCOUNTER — Encounter: Payer: Self-pay | Admitting: Family Medicine

## 2017-06-15 VITALS — BP 138/88 | Ht <= 58 in | Wt 106.0 lb

## 2017-06-15 DIAGNOSIS — M1612 Unilateral primary osteoarthritis, left hip: Secondary | ICD-10-CM | POA: Diagnosis not present

## 2017-06-15 DIAGNOSIS — Z23 Encounter for immunization: Secondary | ICD-10-CM | POA: Diagnosis not present

## 2017-06-15 DIAGNOSIS — E784 Other hyperlipidemia: Secondary | ICD-10-CM | POA: Diagnosis not present

## 2017-06-15 DIAGNOSIS — M25549 Pain in joints of unspecified hand: Secondary | ICD-10-CM | POA: Diagnosis not present

## 2017-06-15 DIAGNOSIS — E7849 Other hyperlipidemia: Secondary | ICD-10-CM

## 2017-06-15 MED ORDER — PRAVASTATIN SODIUM 80 MG PO TABS: 80.0000 mg | ORAL_TABLET | Freq: Every day | ORAL | 6 refills | Status: DC

## 2017-06-15 NOTE — Progress Notes (Signed)
   Subjective:    Patient ID: Andrea Mays, female    DOB: 1951/04/04, 66 y.o.   MRN: [id number]  Hyperlipidemia  This is a recurrent problem. The current episode started more than 1 year ago. Pertinent negatives include no chest pain or shortness of breath.   Patient takes Pravastatin 40 mg one daily. Eats healthy and some exercise. Patient has osteoarthritis of her hands she also has hyperlipidemia present for years takes medicine under good control not causing any significant troubles  Review of Systems  Constitutional: Negative for activity change, fatigue and fever.  HENT: Negative for congestion.   Respiratory: Negative for cough, chest tightness and shortness of breath.   Cardiovascular: Negative for chest pain and leg swelling.  Gastrointestinal: Negative for abdominal pain.  Skin: Negative for color change.  Neurological: Negative for headaches.  Psychiatric/Behavioral: Negative for behavioral problems.       Objective:   Physical Exam  Constitutional: She appears well-developed and well-nourished. No distress.  HENT:  Head: Normocephalic and atraumatic.  Eyes: Right eye exhibits no discharge. Left eye exhibits no discharge.  Neck: No tracheal deviation present.  Cardiovascular: Normal rate, regular rhythm and normal heart sounds.   No murmur heard. Pulmonary/Chest: Effort normal and breath sounds normal. No respiratory distress. She has no wheezes. She has no rales.  Musculoskeletal: She exhibits no edema.  Lymphadenopathy:    She has no cervical adenopathy.  Neurological: She is alert. She exhibits normal muscle tone.  Skin: Skin is warm and dry. No erythema.  Psychiatric: Her behavior is normal.  Vitals reviewed.   Labs were reviewed her LDL higher than what I like to see at I recommended that we bump up the dose of the presence      Assessment & Plan:  Hyperlipidemia increase pravastatin Recheck lab work again in 6 months watch diet stay active if any  medication issues with medication let us know  Patient had colonoscopy 2011 had tubular adenoma she states gastroenterology told her 10 years for her next colonoscopy that seems a little long I will connect with gastroenterology to find out

## 2017-06-20 ENCOUNTER — Telehealth: Payer: Self-pay | Admitting: Gastroenterology

## 2017-06-20 ENCOUNTER — Telehealth: Payer: Self-pay

## 2017-06-20 ENCOUNTER — Telehealth: Payer: Self-pay | Admitting: Family Medicine

## 2017-06-20 NOTE — Telephone Encounter (Signed)
Andrea Mays, patient was on recall for TCS for 2021. She had tubular adenoma and can have TCS between 5-10 year follow up. PCP inquiring about doing colonoscopy now and [facility] agrees.   Can we make arrangements as appropriate per office protocol, (ie can she be triaged or need ov with h/o polyps).

## 2017-06-20 NOTE — Telephone Encounter (Signed)
Please let her or her husband be aware that we did connect with gastroenterology they stated that they recommend doing a follow-up colonoscopy in the near future. Their office will be connecting with her to schedule this.

## 2017-06-20 NOTE — Telephone Encounter (Signed)
I can triage Andrea Mays and will try to this afternoon.

## 2017-06-21 NOTE — Telephone Encounter (Signed)
Spoke with patient's husband and informed him per Dr.Scott Luking- Dr.Scott spoke with gastroenterology and they stated that patient will need a follow up colonoscopy in the near future. Patient's husband verbalized understanding and stated that gastroenterology called them on yesterday and they have scheduled follow up colonoscopy.

## 2017-06-21 NOTE — Telephone Encounter (Signed)
Gastroenterology Pre-Procedure Review  Request Date: 06/20/2017 Requesting Physician:   Pt's last colonoscopy was 07/19/2010 by Dr. Oneida Alar Hx of tubular adenoma  PATIENT REVIEW QUESTIONS: The patient responded to the following health history questions as indicated:    The following information was given by pt's husband, IAMESH Busey PT DOES NOT SPEAK ENGLISH HE WILL ACCOMPANY HER FOR THE PROCEDURE  1. Diabetes Melitis: no 2. Joint replacements in the past 12 months: no 3. Major health problems in the past 3 months: no 4. Has an artificial valve or MVP: no 5. Has a defibrillator: no 6. Has been advised in past to take antibiotics in advance of a procedure like teeth cleaning: no 7. Family history of colon cancer: no  8. Alcohol Use: no 9. History of sleep apnea: no  10. History of coronary artery or other vascular stents placed within the last 12 months: no 11. History of any prior anesthesia complications: no    MEDICATIONS & ALLERGIES:    Patient reports the following regarding taking any blood thinners:   Plavix? no Aspirin? no Coumadin? no Brilinta? no Xarelto? no Eliquis? no Pradaxa? no Savaysa? no Effient? no  Patient confirms/reports the following medications:  Current Outpatient Prescriptions  Medication Sig Dispense Refill  . aspirin 81 MG tablet Take 81 mg by mouth daily.    . Calcium Carbonate-Vitamin D (CALTRATE 600+D PO) Take by mouth.    . diclofenac (CATAFLAM) 50 MG tablet Take 1 tablet (50 mg total) by mouth 2 (two) times daily. 60 tablet 5  . Multiple Vitamins-Minerals (CENTRUM SILVER PO) Take 1 tablet by mouth daily.    . Omega-3 Fatty Acids (FISH OIL PO) Take 1 capsule by mouth daily.    . pravastatin (PRAVACHOL) 80 MG tablet Take 1 tablet (80 mg total) by mouth daily. 30 tablet 6   No current facility-administered medications for this visit.     Patient confirms/reports the following allergies:  No Known Allergies  No orders of the defined types  were placed in this encounter.   AUTHORIZATION INFORMATION Primary Insurance:   ID #:  Group #:  Pre-Cert / Auth required:  Pre-Cert / Auth #:   Secondary Insurance: D #:   Group #: Pre-Cert / Auth required: Pre-Cert / Auth #:   SCHEDULE INFORMATION: Procedure has been scheduled as follows:  Date: 07/27/2017                  Time:  1:30 PM Location: [facility] Short Stay  This Gastroenterology Pre-Precedure Review Form is being routed to the following provider(s): Barney Drain, MD

## 2017-06-23 NOTE — Telephone Encounter (Signed)
Ok to schedule.

## 2017-06-25 MED ORDER — PEG 3350-KCL-NA BICARB-NACL 420 G PO SOLR: 4000.0000 mL | ORAL | 0 refills | Status: DC

## 2017-06-25 NOTE — Telephone Encounter (Signed)
Rx sent to the pharmacy and instructions mailed to pt.  

## 2017-06-25 NOTE — Telephone Encounter (Signed)
I triaged and have her scheduled. I sent triage to Randall Hiss and he has signed off.

## 2017-06-25 NOTE — Telephone Encounter (Signed)
Have you had a chance to triage?

## 2017-06-25 NOTE — Telephone Encounter (Signed)
Sorry, I didn't see any orders in EPIC.

## 2017-06-26 ENCOUNTER — Other Ambulatory Visit: Payer: Self-pay

## 2017-06-26 DIAGNOSIS — Z86010 Personal history of colonic polyps: Secondary | ICD-10-CM

## 2017-06-26 NOTE — Telephone Encounter (Signed)
Thanks Magda Paganini, I have put them in.

## 2017-07-02 NOTE — Telephone Encounter (Signed)
PT's husband came by and rescheduled appt to 08/17/2017 at 9:30 AM. Andrea Mays is aware and new instructions were given to him.

## 2017-07-04 DIAGNOSIS — H5201 Hypermetropia, right eye: Secondary | ICD-10-CM | POA: Diagnosis not present

## 2017-07-04 DIAGNOSIS — H269 Unspecified cataract: Secondary | ICD-10-CM | POA: Diagnosis not present

## 2017-07-04 DIAGNOSIS — H524 Presbyopia: Secondary | ICD-10-CM | POA: Diagnosis not present

## 2017-07-24 ENCOUNTER — Telehealth: Payer: Self-pay | Admitting: *Deleted

## 2017-07-24 ENCOUNTER — Encounter: Payer: Self-pay | Admitting: *Deleted

## 2017-07-24 NOTE — Telephone Encounter (Signed)
Called patient and appointment for procedure r/s'd from 08/17/17 to 09/07/17 at 9:45am. Aware I am mailing new instructions w/ appt date/time. Nothing further needed

## 2017-08-08 ENCOUNTER — Other Ambulatory Visit: Payer: Self-pay | Admitting: Family Medicine

## 2017-08-08 DIAGNOSIS — Z1231 Encounter for screening mammogram for malignant neoplasm of breast: Secondary | ICD-10-CM

## 2017-08-24 ENCOUNTER — Ambulatory Visit (HOSPITAL_COMMUNITY)
Admission: RE | Admit: 2017-08-24 | Discharge: 2017-08-24 | Disposition: A | Discharge status: Home / Self Care | Payer: PPO | Source: Ambulatory Visit | Attending: Family Medicine | Admitting: Family Medicine

## 2017-08-24 ENCOUNTER — Encounter (HOSPITAL_COMMUNITY): Payer: Self-pay

## 2017-08-24 DIAGNOSIS — Z1231 Encounter for screening mammogram for malignant neoplasm of breast: Secondary | ICD-10-CM | POA: Insufficient documentation

## 2017-09-07 ENCOUNTER — Ambulatory Visit (HOSPITAL_COMMUNITY)
Admission: RE | Admit: 2017-09-07 | Discharge: 2017-09-07 | Disposition: A | Discharge status: Home / Self Care | Payer: PPO | Source: Ambulatory Visit | Attending: Gastroenterology | Admitting: Gastroenterology

## 2017-09-07 ENCOUNTER — Encounter (HOSPITAL_COMMUNITY)
Admission: RE | Disposition: A | Discharge status: Home / Self Care | Payer: Self-pay | Source: Ambulatory Visit | Attending: Gastroenterology

## 2017-09-07 ENCOUNTER — Other Ambulatory Visit: Payer: Self-pay

## 2017-09-07 DIAGNOSIS — K648 Other hemorrhoids: Secondary | ICD-10-CM | POA: Diagnosis not present

## 2017-09-07 DIAGNOSIS — D125 Benign neoplasm of sigmoid colon: Secondary | ICD-10-CM | POA: Insufficient documentation

## 2017-09-07 DIAGNOSIS — Z79899 Other long term (current) drug therapy: Secondary | ICD-10-CM | POA: Insufficient documentation

## 2017-09-07 DIAGNOSIS — D126 Benign neoplasm of colon, unspecified: Secondary | ICD-10-CM

## 2017-09-07 DIAGNOSIS — Z1211 Encounter for screening for malignant neoplasm of colon: Secondary | ICD-10-CM | POA: Insufficient documentation

## 2017-09-07 DIAGNOSIS — Z96642 Presence of left artificial hip joint: Secondary | ICD-10-CM | POA: Insufficient documentation

## 2017-09-07 DIAGNOSIS — K5730 Diverticulosis of large intestine without perforation or abscess without bleeding: Secondary | ICD-10-CM | POA: Diagnosis not present

## 2017-09-07 DIAGNOSIS — K644 Residual hemorrhoidal skin tags: Secondary | ICD-10-CM | POA: Insufficient documentation

## 2017-09-07 DIAGNOSIS — Z7982 Long term (current) use of aspirin: Secondary | ICD-10-CM | POA: Insufficient documentation

## 2017-09-07 DIAGNOSIS — Z86010 Personal history of colonic polyps: Secondary | ICD-10-CM | POA: Insufficient documentation

## 2017-09-07 HISTORY — PX: POLYPECTOMY: SHX5525

## 2017-09-07 HISTORY — PX: COLONOSCOPY: SHX5424

## 2017-09-07 SURGERY — COLONOSCOPY
Anesthesia: Moderate Sedation

## 2017-09-07 MED ORDER — SODIUM CHLORIDE 0.9 % IV SOLN
INTRAVENOUS | Status: DC
  Administered 2017-09-07: 09:00:00 via INTRAVENOUS

## 2017-09-07 MED ORDER — MIDAZOLAM HCL 5 MG/5ML IJ SOLN
INTRAMUSCULAR | Status: AC
  Filled 2017-09-07: qty 10

## 2017-09-07 MED ORDER — MEPERIDINE HCL 100 MG/ML IJ SOLN
INTRAMUSCULAR | Status: AC
  Filled 2017-09-07: qty 2

## 2017-09-07 MED ORDER — MEPERIDINE HCL 100 MG/ML IJ SOLN
INTRAMUSCULAR | Status: DC | PRN
  Administered 2017-09-07 (×2): 25 mg via INTRAVENOUS

## 2017-09-07 MED ORDER — STERILE WATER FOR IRRIGATION IR SOLN
Status: DC | PRN
  Administered 2017-09-07: 2.5 mL

## 2017-09-07 MED ORDER — MIDAZOLAM HCL 5 MG/5ML IJ SOLN
INTRAMUSCULAR | Status: DC | PRN
  Administered 2017-09-07 (×2): 2 mg via INTRAVENOUS

## 2017-09-07 NOTE — H&P (Signed)
Primary Care Physician:  Kathyrn Drown, MD Primary Gastroenterologist:  Dr. Oneida Alar  Pre-Procedure History & Physical: HPI:  Andrea Mays is a 66 y.o. female here for [place].  Past Medical History:  Diagnosis Date  . No abnormality seen     Past Surgical History:  Procedure Laterality Date  . TOTAL HIP ARTHROPLASTY Left 11/25/2014   Procedure: LEFT TOTAL HIP ARTHROPLASTY;  Surgeon: Carole Civil, MD;  Location: [facility];  Service: Orthopedics;  Laterality: Left;    Prior to Admission medications   Medication Sig Start Date End Date Taking? Authorizing Provider  aspirin 81 MG tablet Take 81 mg by mouth daily.   Yes [provider]  Calcium Carbonate-Vitamin D (CALTRATE 600+D PO) Take 1 tablet by mouth 2 (two) times daily.    Yes [provider]  diclofenac (CATAFLAM) 50 MG tablet Take 1 tablet (50 mg total) by mouth 2 (two) times daily. Patient taking differently: Take 50 mg by mouth 2 (two) times daily as needed (pain).  11/23/15  Yes Carole Civil, MD  ibuprofen (ADVIL,MOTRIN) 200 MG tablet Take 200 mg by mouth daily as needed for headache or moderate pain.   Yes [provider]  Multiple Vitamins-Minerals (CENTRUM SILVER PO) Take 1 tablet by mouth daily.   Yes [provider]  Omega-3 Fatty Acids (FISH OIL PO) Take 1 capsule by mouth daily.   Yes [provider]  Polyethyl Glycol-Propyl Glycol (SYSTANE OP) Apply 1 drop to eye daily as needed (dry eyes).   Yes [provider]  polyethylene glycol-electrolytes (TRILYTE) 420 g solution Take 4,000 mLs by mouth as directed. 06/25/17  Yes Jae Bruck L, MD  pravastatin (PRAVACHOL) 80 MG tablet Take 1 tablet (80 mg total) by mouth daily. 06/15/17  Yes Kathyrn Drown, MD    Allergies as of 06/26/2017  . (No Known Allergies)    No family history on file.  Social History   Socioeconomic History  . Marital status: Married    Spouse name: Not on file  .  Number of children: Not on file  . Years of education: Not on file  . Highest education level: Not on file  Social Needs  . Financial resource strain: Not on file  . Food insecurity - worry: Not on file  . Food insecurity - inability: Not on file  . Transportation needs - medical: Not on file  . Transportation needs - non-medical: Not on file  Occupational History  . Not on file  Tobacco Use  . Smoking status: Never Smoker  . Smokeless tobacco: Never Used  Substance and Sexual Activity  . Alcohol use: No  . Drug use: No  . Sexual activity: Not Currently  Other Topics Concern  . Not on file  Social History Narrative  . Not on file    Review of Systems: See HPI, otherwise negative ROS   Physical Exam: BP 131/83   Pulse 82   Temp 99.1 F (37.3 C) (Oral)   Resp 19   SpO2 98%  General:   Alert,  pleasant and cooperative in NAD Head:  Normocephalic and atraumatic. Neck:  Supple; Lungs:  Clear throughout to auscultation.    Heart:  Regular rate and rhythm. Abdomen:  Soft, nontender and nondistended. Normal bowel sounds, without guarding, and without rebound.   Neurologic:  Alert and  oriented x4;  grossly normal neurologically.  Impression/Plan:     SCREENING  Plan:  1. TCS TODAY DISCUSSED PROCEDURE, BENEFITS, &  RISKS: < 1% chance of medication reaction, bleeding, perforation, or rupture of spleen/liver.

## 2017-09-07 NOTE — Discharge Instructions (Signed)
You had 1 small polyp removed. You have SMALL internal hemorrhoids.   DRINK WATER TO KEEP YOUR URINE LIGHT YELLOW.  FOLLOW A HIGH FIBER DIET. AVOID ITEMS THAT CAUSE BLOATING & GAS. SEE INFO BELOW.  YOUR BIOPSY RESULTS WILL BE AVAILABLE IN MY CHART AFTER DEC 4 AND MY OFFICE WILL CONTACT YOU IN 10-14 DAYS WITH YOUR RESULTS.   Next colonoscopy in 5-10 years.    Colonoscopy Care After Read the instructions outlined below and refer to this sheet in the next week. These discharge instructions provide you with general information on caring for yourself after you leave the hospital. While your treatment has been planned according to the most current medical practices available, unavoidable complications occasionally occur. If you have any problems or questions after discharge, call DR. Aisa Schoeppner, [phone].  ACTIVITY  You may resume your regular activity, but move at a slower pace for the next 24 hours.   Take frequent rest periods for the next 24 hours.   Walking will help get rid of the air and reduce the bloated feeling in your belly (abdomen).   No driving for 24 hours (because of the medicine (anesthesia) used during the test).   You may shower.   Do not sign any important legal documents or operate any machinery for 24 hours (because of the anesthesia used during the test).    NUTRITION  Drink plenty of fluids.   You may resume your normal diet as instructed by your doctor.   Begin with a light meal and progress to your normal diet. Heavy or fried foods are harder to digest and may make you feel sick to your stomach (nauseated).   Avoid alcoholic beverages for 24 hours or as instructed.    MEDICATIONS  You may resume your normal medications.   WHAT YOU CAN EXPECT TODAY  Some feelings of bloating in the abdomen.   Passage of more gas than usual.   Spotting of blood in your stool or on the toilet paper  .  IF YOU HAD POLYPS REMOVED DURING THE COLONOSCOPY:  Eat a  soft diet IF YOU HAVE NAUSEA, BLOATING, ABDOMINAL PAIN, OR VOMITING.    FINDING OUT THE RESULTS OF YOUR TEST Not all test results are available during your visit. DR. Oneida Alar WILL CALL YOU WITHIN 14 DAYS OF YOUR PROCEDUE WITH YOUR RESULTS. Do not assume everything is normal if you have not heard from DR. Amaryah Mallen, CALL HER OFFICE AT [phone].  SEEK IMMEDIATE MEDICAL ATTENTION AND CALL THE OFFICE: [phone] IF:  You have more than a spotting of blood in your stool.   Your belly is swollen (abdominal distention).   You are nauseated or vomiting.   You have a temperature over 101F.   You have abdominal pain or discomfort that is severe or gets worse throughout the day.   High-Fiber Diet A high-fiber diet changes your normal diet to include more whole grains, legumes, fruits, and vegetables. Changes in the diet involve replacing refined carbohydrates with unrefined foods. The calorie level of the diet is essentially unchanged. The Dietary Reference Intake (recommended amount) for adult males is 38 grams per day. For adult females, it is 25 grams per day. Pregnant and lactating women should consume 28 grams of fiber per day. Fiber is the intact part of a plant that is not broken down during digestion. Functional fiber is fiber that has been isolated from the plant to provide a beneficial effect in the body. PURPOSE  Increase stool bulk.  Ease and regulate bowel movements.   Lower cholesterol.   REDUCE RISK OF COLON CANCER  INDICATIONS THAT YOU NEED MORE FIBER  Constipation and hemorrhoids.   Uncomplicated diverticulosis (intestine condition) and irritable bowel syndrome.   Weight management.   As a protective measure against hardening of the arteries (atherosclerosis), diabetes, and cancer.   GUIDELINES FOR INCREASING FIBER IN THE DIET  Start adding fiber to the diet slowly. A gradual increase of about 5 more grams (2 slices of whole-wheat bread, 2 servings of most fruits  or vegetables, or 1 bowl of high-fiber cereal) per day is best. Too rapid an increase in fiber may result in constipation, flatulence, and bloating.   Drink enough water and fluids to keep your urine clear or pale yellow. Water, juice, or caffeine-free drinks are recommended. Not drinking enough fluid may cause constipation.   Eat a variety of high-fiber foods rather than one type of fiber.   Try to increase your intake of fiber through using high-fiber foods rather than fiber pills or supplements that contain small amounts of fiber.   The goal is to change the types of food eaten. Do not supplement your present diet with high-fiber foods, but replace foods in your present diet.   INCLUDE A VARIETY OF FIBER SOURCES  Replace refined and processed grains with whole grains, canned fruits with fresh fruits, and incorporate other fiber sources. White rice, white breads, and most bakery goods contain little or no fiber.   Brown whole-grain rice, buckwheat oats, and many fruits and vegetables are all good sources of fiber. These include: broccoli, Brussels sprouts, cabbage, cauliflower, beets, sweet potatoes, white potatoes (skin on), carrots, tomatoes, eggplant, squash, berries, fresh fruits, and dried fruits.   Cereals appear to be the richest source of fiber. Cereal fiber is found in whole grains and bran. Bran is the fiber-rich outer coat of cereal grain, which is largely removed in refining. In whole-grain cereals, the bran remains. In breakfast cereals, the largest amount of fiber is found in those with "bran" in their names. The fiber content is sometimes indicated on the label.   You may need to include additional fruits and vegetables each day.   In baking, for 1 cup white flour, you may use the following substitutions:   1 cup whole-wheat flour minus 2 tablespoons.   1/2 cup white flour plus 1/2 cup whole-wheat flour.   Polyps, Colon  A polyp is extra tissue that grows inside your body.  Colon polyps grow in the large intestine. The large intestine, also called the colon, is part of your digestive system. It is a long, hollow tube at the end of your digestive tract where your body makes and stores stool. Most polyps are not dangerous. They are benign. This means they are not cancerous. But over time, some types of polyps can turn into cancer. Polyps that are smaller than a pea are usually not harmful. But larger polyps could someday become or may already be cancerous. To be safe, doctors remove all polyps and test them.   WHO GETS POLYPS? Anyone can get polyps, but certain people are more likely than others. You may have a greater chance of getting polyps if:  You are over 50.   You have had polyps before.   Someone in your family has had polyps.   Someone in your family has had cancer of the large intestine.   Find out if someone in your family has had polyps. You may also be  more likely to get polyps if you:   Eat a lot of fatty foods   Smoke   Drink alcohol   Do not exercise  Eat too much   PREVENTION There is not one sure way to prevent polyps. You might be able to lower your risk of getting them if you:  Eat more fruits and vegetables and less fatty food.   Do not smoke.   Avoid alcohol.   Exercise every day.   Lose weight if you are overweight.   Eating more calcium and folate can also lower your risk of getting polyps. Some foods that are rich in calcium are milk, cheese, and broccoli. Some foods that are rich in folate are chickpeas, kidney beans, and spinach.

## 2017-09-07 NOTE — Op Note (Signed)
[facility] Patient Name: Andrea Mays Procedure Date: 09/07/2017 10:01 AM MRN: [id number] Date of Birth: January 30, 1951 Attending MD: Barney Drain MD, MD CSN: [id number] Age: 66 Admit Type: Outpatient Procedure:                Colonoscopy WITH COLD SNARE POLYPECTOMY Indications:              Personal history of colonic polyps Providers:                Barney Drain MD, MD, Janeece Riggers, RN, Lurline Del, RN Referring MD:             Elayne Snare. Luking Medicines:                Meperidine 50 mg IV, Midazolam 5 mg IV Complications:            No immediate complications. Estimated Blood Loss:     Estimated blood loss was minimal. Procedure:                Pre-Anesthesia Assessment:                           - Prior to the procedure, a History and Physical                            was performed, and patient medications and                            allergies were reviewed. The patient's tolerance of                            previous anesthesia was also reviewed. The risks                            and benefits of the procedure and the sedation                            options and risks were discussed with the patient.                            All questions were answered, and informed consent                            was obtained. Prior Anticoagulants: The patient has                            taken aspirin, last dose was 2 days prior to                            procedure. ASA Grade Assessment: II - A patient                            with mild systemic disease. After reviewing the                            risks and benefits, the patient was deemed in  satisfactory condition to undergo the procedure.                            After obtaining informed consent, the colonoscope                            was passed under direct vision. Throughout the                            procedure, the patient's blood pressure, pulse, and      oxygen saturations were monitored continuously. The                            EC-[id number] ([id number]) scope was introduced through                            the anus and advanced to the the cecum, identified                            by appendiceal orifice and ileocecal valve. The                            colonoscopy was technically difficult and complex                            due to a tortuous colon. Successful completion of                            the procedure was aided by increasing the dose of                            sedation medication and COLOWRAP. The patient                            tolerated the procedure fairly well. The quality of                            the bowel preparation was excellent. The ileocecal                            valve, appendiceal orifice, and rectum were                            photographed. Scope In: 10:33:57 AM Scope Out: 10:49:21 AM Scope Withdrawal Time: 0 hours 12 minutes 25 seconds  Total Procedure Duration: 0 hours 15 minutes 24 seconds  Findings:      A 4 mm polyp was found in the sigmoid colon. The polyp was sessile. The       polyp was removed with a cold snare. Resection and retrieval were       complete.      A few small-mouthed diverticula were found in the sigmoid colon.      External and internal hemorrhoids were found during retroflexion. The       hemorrhoids were small. Impression:               -  One 4 mm polyp in the sigmoid colon, removed with                            a cold snare. Resected and retrieved.                           - Diverticulosis in the sigmoid colon.                           - External and internal hemorrhoids. Moderate Sedation:      Moderate (conscious) sedation was administered by the endoscopy nurse       and supervised by the endoscopist. The following parameters were       monitored: oxygen saturation, heart rate, blood pressure, and response       to care. Total physician  intraservice time was 29 minutes. Recommendation:           - Repeat colonoscopy in 5-10 years for surveillance.                           - High fiber diet.                           - Continue present medications.                           - Await pathology results.                           - Patient has a contact number available for                            emergencies. The signs and symptoms of potential                            delayed complications were discussed with the                            patient. Return to normal activities tomorrow.                            Written discharge instructions were provided to the                            patient. Procedure Code(s):        --- Professional ---                           [phone], Colonoscopy, flexible; with removal of                            tumor(s), polyp(s), or other lesion(s) by snare                            technique  [id number], Moderate sedation services provided by the                            same physician or other qualified health care                            professional performing the diagnostic or                            therapeutic service that the sedation supports,                            requiring the presence of an independent trained                            observer to assist in the monitoring of the                            patient's level of consciousness and physiological                            status; initial 15 minutes of intraservice time,                            patient age 24 years or older                           [phone], Moderate sedation services; each additional                            15 minutes intraservice time Diagnosis Code(s):        --- Professional ---                           D12.5, Benign neoplasm of sigmoid colon                           K64.8, Other hemorrhoids                           Z86.010, Personal history of colonic  polyps                           K57.30, Diverticulosis of large intestine without                            perforation or abscess without bleeding CPT copyright 2016 [employer]. All rights reserved. The codes documented in this report are preliminary and upon coder review may  be revised to meet current compliance requirements. Barney Drain, MD Barney Drain MD, MD 09/07/2017 10:59:07 AM This report has been signed electronically. Number of Addenda: 0

## 2017-09-10 ENCOUNTER — Telehealth: Payer: Self-pay | Admitting: Gastroenterology

## 2017-09-10 ENCOUNTER — Encounter (HOSPITAL_COMMUNITY): Payer: Self-pay | Admitting: Gastroenterology

## 2017-09-10 NOTE — Telephone Encounter (Signed)
Please call pt. She had a simple adenoma removed from her colon. FOLLOW A High fiber diet. NEXT TCS BETWEEN 2023-2025.

## 2017-09-11 NOTE — Telephone Encounter (Signed)
Pts husband is aware.  °

## 2017-09-11 NOTE — Telephone Encounter (Signed)
Reminder in epic °

## 2017-12-03 ENCOUNTER — Ambulatory Visit (INDEPENDENT_AMBULATORY_CARE_PROVIDER_SITE_OTHER): Payer: PPO

## 2017-12-03 ENCOUNTER — Ambulatory Visit: Payer: PPO | Admitting: Orthopedic Surgery

## 2017-12-03 VITALS — BP 140/92 | HR 94 | Ht <= 58 in | Wt 106.0 lb

## 2017-12-03 DIAGNOSIS — Z96642 Presence of left artificial hip joint: Secondary | ICD-10-CM

## 2017-12-03 NOTE — Progress Notes (Signed)
Patient ID: Andrea Mays, female   DOB: [birth year], 67 y.o.   MRN: [id number]  Chief Complaint  Patient presents with  . Follow-up    Recheck on left hip, DOS 11-25-14.    HPI Andrea Mays is a 67 y.o. female.   Status post  total hip arthroplasty. The patient is doing well the hip implant is functioning well.  Review of Systems Review of Systems  Constitutional symptoms no fever or chills Neurologic symptoms no numbness or tingling Musculoskeletal complains of knee pain medially   Past Medical History:  Diagnosis Date  . No abnormality seen     Past Surgical History:  Procedure Laterality Date  . COLONOSCOPY N/A 09/07/2017   Procedure: COLONOSCOPY;  Surgeon: Danie Binder, MD;  Location: [facility];  Service: Endoscopy;  Laterality: N/A;  1:30  . POLYPECTOMY  09/07/2017   Procedure: POLYPECTOMY;  Surgeon: Danie Binder, MD;  Location: [facility];  Service: Endoscopy;;  SIGMOID  . TOTAL HIP ARTHROPLASTY Left 11/25/2014   Procedure: LEFT TOTAL HIP ARTHROPLASTY;  Surgeon: Carole Civil, MD;  Location: [facility];  Service: Orthopedics;  Laterality: Left;     No Known Allergies  Current Outpatient Medications  Medication Sig Dispense Refill  . aspirin 81 MG tablet Take 81 mg by mouth daily.    . Calcium Carbonate-Vitamin D (CALTRATE 600+D PO) Take 1 tablet by mouth 2 (two) times daily.     . diclofenac (CATAFLAM) 50 MG tablet Take 1 tablet (50 mg total) by mouth 2 (two) times daily. (Patient taking differently: Take 50 mg by mouth 2 (two) times daily as needed (pain). ) 60 tablet 5  . ibuprofen (ADVIL,MOTRIN) 200 MG tablet Take 200 mg by mouth daily as needed for headache or moderate pain.    . Multiple Vitamins-Minerals (CENTRUM SILVER PO) Take 1 tablet by mouth daily.    . Omega-3 Fatty Acids (FISH OIL PO) Take 1 capsule by mouth daily.    Vladimir Faster Glycol-Propyl Glycol (SYSTANE OP) Apply 1 drop to eye daily as needed (dry eyes).    . pravastatin  (PRAVACHOL) 80 MG tablet Take 1 tablet (80 mg total) by mouth daily. 30 tablet 6   No current facility-administered medications for this visit.      Physical Exam Blood pressure (!) 140/92, pulse 94, height 4\' 6"  (1.372 m), weight 106 lb (48.1 kg).  Overall appearance normal grooming and hygiene normal. The patient is awake alert and oriented 3 with a pleasant mood and affect. The patient is ambulatory without assistive device and without limping.  Left HIP: The skin incision is healed without any erythema or tenderness or neuroma. Hip flexion strength grade 5. Hip is stable to post pull and abduction stress test. Hip flexion is 120. There is no tenderness or swelling around the hip. Distal pulses are intact sensation is normal and there is no lymphadenopathy in the groin patient walks with normal balance and coordination  Left knee tenderness medially but she maintains excellent range of motion  Data Reviewed Today's imaging shows stable left total hip implant without loosening or complication  Assessment Status post left total hip, stable   Plan Routine repeat x-ray right hip in one year  Tylenol for knee pain if needed  2:24 PM Arther Abbott, MD 12/03/2017

## 2017-12-04 ENCOUNTER — Ambulatory Visit: Payer: PPO | Admitting: Orthopedic Surgery

## 2017-12-06 ENCOUNTER — Telehealth: Payer: Self-pay | Admitting: Family Medicine

## 2017-12-06 DIAGNOSIS — E785 Hyperlipidemia, unspecified: Secondary | ICD-10-CM

## 2017-12-06 DIAGNOSIS — Z79899 Other long term (current) drug therapy: Secondary | ICD-10-CM

## 2017-12-06 DIAGNOSIS — R739 Hyperglycemia, unspecified: Secondary | ICD-10-CM

## 2017-12-06 NOTE — Telephone Encounter (Signed)
Patient is aware labs are placed in epic fasting. Was the inhaler a mistake? As I do not see any hx of a inhaler for pt so I called her and she states she did not request. Please advise.

## 2017-12-06 NOTE — Telephone Encounter (Signed)
Pt is requesting lab orders to be sent over for an upcoming appt with Dr. Nicki Reaper. Last labs per Epic were: bmp,hepatic,and lipid on 06/07/2017.

## 2017-12-06 NOTE — Telephone Encounter (Signed)
Epic mistake-no inhaler

## 2017-12-06 NOTE — Telephone Encounter (Signed)
Patient wants Ventolin specifically 2 puffs every 4 hours as needed lipid, liver, glucose

## 2017-12-11 DIAGNOSIS — Z79899 Other long term (current) drug therapy: Secondary | ICD-10-CM | POA: Diagnosis not present

## 2017-12-11 DIAGNOSIS — E785 Hyperlipidemia, unspecified: Secondary | ICD-10-CM | POA: Diagnosis not present

## 2017-12-11 DIAGNOSIS — R739 Hyperglycemia, unspecified: Secondary | ICD-10-CM | POA: Diagnosis not present

## 2017-12-12 LAB — LIPID PANEL
CHOL/HDL RATIO: 3.3 ratio (ref 0.0–4.4)
Cholesterol, Total: 196 mg/dL (ref 100–199)
HDL: 59 mg/dL (ref >39)
LDL Calculated: 95 mg/dL (ref 0–99)
TRIGLYCERIDES: 211 mg/dL — AB (ref 0–149)
VLDL Cholesterol Cal: 42 mg/dL — ABNORMAL HIGH (ref 5–40)

## 2017-12-12 LAB — HEPATIC FUNCTION PANEL
ALBUMIN: 4.5 g/dL (ref 3.6–4.8)
ALK PHOS: 93 IU/L (ref 39–117)
ALT: 20 IU/L (ref 0–32)
AST: 19 IU/L (ref 0–40)
Bilirubin Total: 0.4 mg/dL (ref 0.0–1.2)
Bilirubin, Direct: 0.11 mg/dL (ref 0.00–0.40)
Total Protein: 7.1 g/dL (ref 6.0–8.5)

## 2017-12-12 LAB — GLUCOSE, RANDOM: Glucose: 88 mg/dL (ref 65–99)

## 2017-12-24 ENCOUNTER — Encounter: Payer: Self-pay | Admitting: Family Medicine

## 2017-12-24 ENCOUNTER — Ambulatory Visit (INDEPENDENT_AMBULATORY_CARE_PROVIDER_SITE_OTHER): Payer: PPO | Admitting: Family Medicine

## 2017-12-24 VITALS — BP 134/80 | Ht <= 58 in | Wt 108.6 lb

## 2017-12-24 DIAGNOSIS — E7849 Other hyperlipidemia: Secondary | ICD-10-CM

## 2017-12-24 MED ORDER — PRAVASTATIN SODIUM 80 MG PO TABS: 80.0000 mg | ORAL_TABLET | Freq: Every day | ORAL | 1 refills | Status: DC

## 2017-12-24 NOTE — Progress Notes (Signed)
Subjective:    Patient ID: Andrea Mays, female    DOB: 02/16/1951, 67 y.o.   MRN: [id number]  HPI Patient here today for follow up on hyperlipidemia.  Patient here for follow-up regarding cholesterol.  Patient does try to maintain a reasonable diet.  Patient does take the medication on a regular basis.  Denies missing a dose.  The patient denies any obvious side effects.  Prior blood work results reviewed with the patient.  The patient is aware of his cholesterol goals and the need to keep it under good control to lessen the risk of disease.  Patient also has osteoarthritis there is nothing surgical or medical that can be done other than anti-inflammatories as needed Results for orders placed or performed in visit on 12/06/17  Hepatic function panel  Result Value Ref Range   Total Protein 7.1 6.0 - 8.5 g/dL   Albumin 4.5 3.6 - 4.8 g/dL   Bilirubin Total 0.4 0.0 - 1.2 mg/dL   Bilirubin, Direct 0.11 0.00 - 0.40 mg/dL   Alkaline Phosphatase 93 39 - 117 IU/L   AST 19 0 - 40 IU/L   ALT 20 0 - 32 IU/L  Lipid panel  Result Value Ref Range   Cholesterol, Total 196 100 - 199 mg/dL   Triglycerides 211 (H) 0 - 149 mg/dL   HDL 59 >39 mg/dL   VLDL Cholesterol Cal 42 (H) 5 - 40 mg/dL   LDL Calculated 95 0 - 99 mg/dL   Chol/HDL Ratio 3.3 0.0 - 4.4 ratio  Glucose, random  Result Value Ref Range   Glucose 88 65 - 99 mg/dL     Review of Systems  Constitutional: Negative for activity change, fatigue and fever.  HENT: Negative for congestion.   Respiratory: Negative for cough, chest tightness and shortness of breath.   Cardiovascular: Negative for chest pain and leg swelling.  Gastrointestinal: Negative for abdominal pain.  Skin: Negative for color change.  Neurological: Negative for headaches.  Psychiatric/Behavioral: Negative for behavioral problems.       Objective:   Physical Exam  Constitutional: She appears well-developed and well-nourished. No distress.  HENT:  Head:  Normocephalic and atraumatic.  Eyes: Right eye exhibits no discharge. Left eye exhibits no discharge.  Neck: No tracheal deviation present.  Cardiovascular: Normal rate, regular rhythm and normal heart sounds.  No murmur heard. Pulmonary/Chest: Effort normal and breath sounds normal. No respiratory distress. She has no wheezes. She has no rales.  Musculoskeletal: She exhibits no edema.  Lymphadenopathy:    She has no cervical adenopathy.  Neurological: She is alert. She exhibits normal muscle tone.  Skin: Skin is warm and dry. No erythema.  Psychiatric: Her behavior is normal.  Vitals reviewed.   Patient had a colonoscopy in November things went well tubular adenoma they will repeat this again in 5 years      Assessment & Plan:  The patient was seen today as part of an evaluation regarding hyperlipidemia. Recent lab work has been reviewed with the patient as well as the goals for good cholesterol care. In addition to this medications have been discussed the importance of compliance with diet and medications discussed as well. Patient has been informed of potential side effects of medications in the importance to notify us should any problems occur. Finally the patient is aware that poor control of cholesterol, noncompliance can dramatically increase her risk of heart attack strokes and premature death. The patient will keep regular office visits and the patient  does agreed to periodic lab work. Arthritis Symptomatic use OTC meds as needed

## 2017-12-24 NOTE — Patient Instructions (Signed)
Results for orders placed or performed in visit on 12/06/17  Hepatic function panel  Result Value Ref Range   Total Protein 7.1 6.0 - 8.5 g/dL   Albumin 4.5 3.6 - 4.8 g/dL   Bilirubin Total 0.4 0.0 - 1.2 mg/dL   Bilirubin, Direct 0.11 0.00 - 0.40 mg/dL   Alkaline Phosphatase 93 39 - 117 IU/L   AST 19 0 - 40 IU/L   ALT 20 0 - 32 IU/L  Lipid panel  Result Value Ref Range   Cholesterol, Total 196 100 - 199 mg/dL   Triglycerides 211 (H) 0 - 149 mg/dL   HDL 59 >39 mg/dL   VLDL Cholesterol Cal 42 (H) 5 - 40 mg/dL   LDL Calculated 95 0 - 99 mg/dL   Chol/HDL Ratio 3.3 0.0 - 4.4 ratio  Glucose, random  Result Value Ref Range   Glucose 88 65 - 99 mg/dL

## 2018-06-12 ENCOUNTER — Telehealth: Payer: Self-pay | Admitting: Family Medicine

## 2018-06-12 DIAGNOSIS — Z79899 Other long term (current) drug therapy: Secondary | ICD-10-CM

## 2018-06-12 DIAGNOSIS — E785 Hyperlipidemia, unspecified: Secondary | ICD-10-CM

## 2018-06-12 NOTE — Telephone Encounter (Signed)
Pt would like an order for lab work to be placed before pt comes in for appt on 06/26/18. Please advise and call pt when done.

## 2018-06-12 NOTE — Telephone Encounter (Signed)
Last labs done 12/2017- Lipid, Liver and glucose

## 2018-06-12 NOTE — Telephone Encounter (Signed)
Orders put in. Pt notified.  

## 2018-06-12 NOTE — Telephone Encounter (Signed)
Hemoglobin/ hematocrit, lipid, metabolic 7, liver-hyperlipidemia, high risk medications

## 2018-06-17 DIAGNOSIS — E785 Hyperlipidemia, unspecified: Secondary | ICD-10-CM | POA: Diagnosis not present

## 2018-06-17 DIAGNOSIS — Z79899 Other long term (current) drug therapy: Secondary | ICD-10-CM | POA: Diagnosis not present

## 2018-06-18 LAB — BASIC METABOLIC PANEL
BUN/Creatinine Ratio: 5 — ABNORMAL LOW (ref 12–28)
BUN: 5 mg/dL — ABNORMAL LOW (ref 8–27)
CO2: 26 mmol/L (ref 20–29)
Calcium: 8.9 mg/dL (ref 8.7–10.3)
Chloride: 100 mmol/L (ref 96–106)
Creatinine, Ser: 0.91 mg/dL (ref 0.57–1.00)
GFR calc Af Amer: 75 mL/min/{1.73_m2} (ref >59)
GFR calc non Af Amer: 65 mL/min/{1.73_m2} (ref >59)
Glucose: 111 mg/dL — ABNORMAL HIGH (ref 65–99)
Potassium: 5 mmol/L (ref 3.5–5.2)
Sodium: 137 mmol/L (ref 134–144)

## 2018-06-18 LAB — LIPID PANEL
Chol/HDL Ratio: 2 ratio (ref 0.0–4.4)
Cholesterol, Total: 161 mg/dL (ref 100–199)
HDL: 80 mg/dL (ref >39)
LDL Calculated: 71 mg/dL (ref 0–99)
Triglycerides: 48 mg/dL (ref 0–149)
VLDL Cholesterol Cal: 10 mg/dL (ref 5–40)

## 2018-06-18 LAB — HEPATIC FUNCTION PANEL
ALT: 13 IU/L (ref 0–32)
AST: 16 IU/L (ref 0–40)
Albumin: 3.6 g/dL (ref 3.6–4.8)
Alkaline Phosphatase: 52 IU/L (ref 39–117)
Bilirubin Total: 0.4 mg/dL (ref 0.0–1.2)
Bilirubin, Direct: 0.14 mg/dL (ref 0.00–0.40)
Total Protein: 5.5 g/dL — ABNORMAL LOW (ref 6.0–8.5)

## 2018-06-18 LAB — HEMOGLOBIN: Hemoglobin: 14.9 g/dL (ref 11.1–15.9)

## 2018-06-18 LAB — HEMATOCRIT: Hematocrit: 45.3 % (ref 34.0–46.6)

## 2018-06-26 ENCOUNTER — Ambulatory Visit (INDEPENDENT_AMBULATORY_CARE_PROVIDER_SITE_OTHER): Payer: PPO | Admitting: Family Medicine

## 2018-06-26 ENCOUNTER — Encounter: Payer: Self-pay | Admitting: Family Medicine

## 2018-06-26 VITALS — BP 148/90 | Ht <= 58 in | Wt 111.0 lb

## 2018-06-26 DIAGNOSIS — E7849 Other hyperlipidemia: Secondary | ICD-10-CM | POA: Diagnosis not present

## 2018-06-26 DIAGNOSIS — Z23 Encounter for immunization: Secondary | ICD-10-CM

## 2018-06-26 DIAGNOSIS — M25549 Pain in joints of unspecified hand: Secondary | ICD-10-CM

## 2018-06-26 MED ORDER — ZOSTER VAC RECOMB ADJUVANTED 50 MCG/0.5ML IM SUSR: 0.5000 mL | Freq: Once | INTRAMUSCULAR | 1 refills | Status: AC

## 2018-06-26 MED ORDER — PRAVASTATIN SODIUM 80 MG PO TABS: 80.0000 mg | ORAL_TABLET | Freq: Every day | ORAL | 1 refills | Status: DC

## 2018-06-26 NOTE — Progress Notes (Signed)
   Subjective:    Patient ID: Andrea Mays, female    DOB: [birth year], 67 y.o.   MRN: [id number]  HPI  Patient is here today to follow up on her chronic health concerns. She eats healthy, and gets exercise. Does not see any specialist. Patient does take her cholesterol medicine Patient here for follow-up regarding cholesterol.  The patient does have hyperlipidemia.  Patient does try to maintain a reasonable diet.  Patient does take the medication on a regular basis.  Denies missing a dose.  The patient denies any obvious side effects.  Prior blood work results reviewed with the patient.  The patient is aware of his cholesterol goals and the need to keep it under good control to lessen the risk of disease.  Review of Systems  Constitutional: Negative for activity change, appetite change and fatigue.  HENT: Negative for congestion and rhinorrhea.   Respiratory: Negative for cough and shortness of breath.   Cardiovascular: Negative for chest pain and leg swelling.  Gastrointestinal: Negative for abdominal pain and diarrhea.  Endocrine: Negative for polydipsia and polyphagia.  Skin: Negative for color change.  Neurological: Negative for dizziness and weakness.  Psychiatric/Behavioral: Negative for behavioral problems and confusion.       Objective:   Physical Exam  Constitutional: She appears well-nourished. No distress.  HENT:  Head: Normocephalic and atraumatic.  Eyes: Right eye exhibits no discharge. Left eye exhibits no discharge.  Neck: No tracheal deviation present.  Cardiovascular: Normal rate, regular rhythm and normal heart sounds.  No murmur heard. Pulmonary/Chest: Effort normal and breath sounds normal. No respiratory distress.  Musculoskeletal: She exhibits no edema.  Lymphadenopathy:    She has no cervical adenopathy.  Neurological: She is alert. Coordination normal.  Skin: Skin is warm and dry.  Psychiatric: She has a normal mood and affect. Her behavior is normal.    Vitals reviewed. Significant osteoarthritis noted in the knees the hands and the feet        Assessment & Plan:  Fasting hyperglycemia minimize starches in diet stay active  Osteoarthritis Tylenol as needed avoid excessive anti-inflammatories  Hyperlipidemia lab work looks very good continue current measures  The risk of 81 mg aspirin outweighs benefit stop aspirin  Follow-up 6 months  shingrix rx given

## 2018-06-26 NOTE — Patient Instructions (Signed)
Stop 81 mg aspirin  Results for orders placed or performed in visit on 06/12/18  Lipid panel  Result Value Ref Range   Cholesterol, Total 161 100 - 199 mg/dL   Triglycerides 48 0 - 149 mg/dL   HDL 80 >39 mg/dL   VLDL Cholesterol Cal 10 5 - 40 mg/dL   LDL Calculated 71 0 - 99 mg/dL   Chol/HDL Ratio 2.0 0.0 - 4.4 ratio  Basic metabolic panel  Result Value Ref Range   Glucose 111 (H) 65 - 99 mg/dL   BUN 5 (L) 8 - 27 mg/dL   Creatinine, Ser 0.91 0.57 - 1.00 mg/dL   GFR calc non Af Amer 65 >59 mL/min/1.73   GFR calc Af Amer 75 >59 mL/min/1.73   BUN/Creatinine Ratio 5 (L) 12 - 28   Sodium 137 134 - 144 mmol/L   Potassium 5.0 3.5 - 5.2 mmol/L   Chloride 100 96 - 106 mmol/L   CO2 26 20 - 29 mmol/L   Calcium 8.9 8.7 - 10.3 mg/dL  Hepatic function panel  Result Value Ref Range   Total Protein 5.5 (L) 6.0 - 8.5 g/dL   Albumin 3.6 3.6 - 4.8 g/dL   Bilirubin Total 0.4 0.0 - 1.2 mg/dL   Bilirubin, Direct 0.14 0.00 - 0.40 mg/dL   Alkaline Phosphatase 52 39 - 117 IU/L   AST 16 0 - 40 IU/L   ALT 13 0 - 32 IU/L  Hemoglobin  Result Value Ref Range   Hemoglobin 14.9 11.1 - 15.9 g/dL  Hematocrit  Result Value Ref Range   Hematocrit 45.3 34.0 - 46.6 %

## 2018-08-08 ENCOUNTER — Other Ambulatory Visit: Payer: Self-pay | Admitting: Family Medicine

## 2018-08-08 DIAGNOSIS — Z1231 Encounter for screening mammogram for malignant neoplasm of breast: Secondary | ICD-10-CM

## 2018-08-26 ENCOUNTER — Ambulatory Visit (HOSPITAL_COMMUNITY)
Admission: RE | Admit: 2018-08-26 | Discharge: 2018-08-26 | Disposition: A | Discharge status: Home / Self Care | Payer: PPO | Source: Ambulatory Visit | Attending: Family Medicine | Admitting: Family Medicine

## 2018-08-26 ENCOUNTER — Encounter (HOSPITAL_COMMUNITY): Payer: Self-pay

## 2018-08-26 DIAGNOSIS — Z1231 Encounter for screening mammogram for malignant neoplasm of breast: Secondary | ICD-10-CM | POA: Diagnosis not present

## 2018-11-25 DIAGNOSIS — H2513 Age-related nuclear cataract, bilateral: Secondary | ICD-10-CM | POA: Diagnosis not present

## 2018-11-25 DIAGNOSIS — H5203 Hypermetropia, bilateral: Secondary | ICD-10-CM | POA: Diagnosis not present

## 2018-11-25 DIAGNOSIS — H269 Unspecified cataract: Secondary | ICD-10-CM | POA: Diagnosis not present

## 2018-11-25 DIAGNOSIS — H524 Presbyopia: Secondary | ICD-10-CM | POA: Diagnosis not present

## 2018-12-04 ENCOUNTER — Ambulatory Visit: Payer: Self-pay | Admitting: Orthopedic Surgery

## 2018-12-16 ENCOUNTER — Other Ambulatory Visit: Payer: Self-pay | Admitting: Family Medicine

## 2018-12-16 ENCOUNTER — Telehealth: Payer: Self-pay | Admitting: Family Medicine

## 2018-12-16 DIAGNOSIS — E7849 Other hyperlipidemia: Secondary | ICD-10-CM

## 2018-12-16 DIAGNOSIS — Z79899 Other long term (current) drug therapy: Secondary | ICD-10-CM

## 2018-12-16 NOTE — Telephone Encounter (Signed)
Had Lip,liver,bmet,H&H done on 06/17/2018. Please advise.

## 2018-12-16 NOTE — Telephone Encounter (Signed)
Husband and Wife both have 6 mos f/u appts on March 18th and would like to have labwork done in advance at [facility].

## 2018-12-16 NOTE — Telephone Encounter (Signed)
Lab orders placed and pt is aware 

## 2018-12-16 NOTE — Telephone Encounter (Signed)
Lipid, liver, met 7-hyperlipidemia high risk med

## 2018-12-18 DIAGNOSIS — E7849 Other hyperlipidemia: Secondary | ICD-10-CM | POA: Diagnosis not present

## 2018-12-18 DIAGNOSIS — Z79899 Other long term (current) drug therapy: Secondary | ICD-10-CM | POA: Diagnosis not present

## 2018-12-19 LAB — LIPID PANEL
Chol/HDL Ratio: 3.3 ratio (ref 0.0–4.4)
Cholesterol, Total: 203 mg/dL — ABNORMAL HIGH (ref 100–199)
HDL: 62 mg/dL (ref >39)
LDL Calculated: 100 mg/dL — ABNORMAL HIGH (ref 0–99)
TRIGLYCERIDES: 205 mg/dL — AB (ref 0–149)
VLDL Cholesterol Cal: 41 mg/dL — ABNORMAL HIGH (ref 5–40)

## 2018-12-19 LAB — BASIC METABOLIC PANEL
BUN/Creatinine Ratio: 19 (ref 12–28)
BUN: 12 mg/dL (ref 8–27)
CO2: 18 mmol/L — ABNORMAL LOW (ref 20–29)
Calcium: 9.5 mg/dL (ref 8.7–10.3)
Chloride: 102 mmol/L (ref 96–106)
Creatinine, Ser: 0.64 mg/dL (ref 0.57–1.00)
GFR, EST AFRICAN AMERICAN: 105 mL/min/{1.73_m2} (ref >59)
GFR, EST NON AFRICAN AMERICAN: 91 mL/min/{1.73_m2} (ref >59)
Glucose: 88 mg/dL (ref 65–99)
POTASSIUM: 4.7 mmol/L (ref 3.5–5.2)
SODIUM: 139 mmol/L (ref 134–144)

## 2018-12-19 LAB — HEPATIC FUNCTION PANEL
ALBUMIN: 4.3 g/dL (ref 3.8–4.8)
ALT: 18 IU/L (ref 0–32)
AST: 20 IU/L (ref 0–40)
Alkaline Phosphatase: 105 IU/L (ref 39–117)
Bilirubin Total: 0.3 mg/dL (ref 0.0–1.2)
Bilirubin, Direct: 0.08 mg/dL (ref 0.00–0.40)
Total Protein: 7.3 g/dL (ref 6.0–8.5)

## 2018-12-25 ENCOUNTER — Other Ambulatory Visit: Payer: Self-pay

## 2018-12-25 ENCOUNTER — Ambulatory Visit (INDEPENDENT_AMBULATORY_CARE_PROVIDER_SITE_OTHER): Payer: PPO | Admitting: Family Medicine

## 2018-12-25 ENCOUNTER — Other Ambulatory Visit: Payer: Self-pay | Admitting: Family Medicine

## 2018-12-25 ENCOUNTER — Encounter: Payer: Self-pay | Admitting: Family Medicine

## 2018-12-25 VITALS — BP 136/80 | Wt 114.8 lb

## 2018-12-25 DIAGNOSIS — M25549 Pain in joints of unspecified hand: Secondary | ICD-10-CM

## 2018-12-25 DIAGNOSIS — E7849 Other hyperlipidemia: Secondary | ICD-10-CM

## 2018-12-25 MED ORDER — AMOXICILLIN 500 MG PO CAPS: ORAL_CAPSULE | ORAL | 0 refills | Status: DC

## 2018-12-25 MED ORDER — ATORVASTATIN CALCIUM 40 MG PO TABS: ORAL_TABLET | ORAL | 6 refills | Status: DC

## 2018-12-25 NOTE — Progress Notes (Signed)
   Subjective:    Patient ID: Andrea Mays, female    DOB: 1951/03/01, 68 y.o.   MRN: [id number]  Hyperlipidemia  This is a chronic problem. Pertinent negatives include no chest pain or shortness of breath. There are no compliance problems.    Pt here for 6 month follow up. Pt states she has been doing well over the past few months.  Very nice patient has been dealing with mild symptomatology over the course of the past multiple months of arthralgias in her shoulders elbows hands and her knees she has a known history of osteoarthritis.  She is also recently done her lab work and tries to watch her diet  Review of Systems  Constitutional: Negative for activity change and appetite change.  HENT: Negative for congestion and rhinorrhea.   Respiratory: Negative for cough and shortness of breath.   Cardiovascular: Negative for chest pain and leg swelling.  Gastrointestinal: Negative for abdominal pain, nausea and vomiting.  Skin: Negative for color change.  Neurological: Negative for dizziness and weakness.  Psychiatric/Behavioral: Negative for agitation and confusion.       Objective:   Physical Exam Vitals signs reviewed.  Constitutional:      General: She is not in acute distress. HENT:     Head: Normocephalic and atraumatic.  Eyes:     General:        Right eye: No discharge.        Left eye: No discharge.  Neck:     Trachea: No tracheal deviation.  Cardiovascular:     Rate and Rhythm: Normal rate and regular rhythm.     Heart sounds: Normal heart sounds. No murmur.  Pulmonary:     Effort: Pulmonary effort is normal. No respiratory distress.     Breath sounds: Normal breath sounds.  Lymphadenopathy:     Cervical: No cervical adenopathy.  Skin:    General: Skin is warm and dry.  Neurological:     Mental Status: She is alert.     Coordination: Coordination normal.  Psychiatric:        Behavior: Behavior normal.   Significant osteoarthritis noted on the day out there  now you get okay just look at their symptomatology       Assessment & Plan:  Her lab work overall looks good fasting sugar looks good cholesterol moderately elevated change cholesterol medicine recommended patient will follow-up in 6 months lab work before that visit Lab work to include met 7 lipid liver A1c Healthy diet recommended. Osteoarthritis-continue anti-inflammatory and regular basis follow-up if progressive troubles or worse  15 minutes was spent with patient today discussing healthcare issues which they came.  More than 50% of this visit-total duration of visit-was spent in counseling and coordination of care.  Please see diagnosis regarding the focus of this coordination and care

## 2018-12-25 NOTE — Patient Instructions (Signed)
Results for orders placed or performed in visit on 03/54/65  Basic Metabolic Panel (BMET)  Result Value Ref Range   Glucose 88 65 - 99 mg/dL   BUN 12 8 - 27 mg/dL   Creatinine, Ser 0.64 0.57 - 1.00 mg/dL   GFR calc non Af Amer 91 >59 mL/min/1.73   GFR calc Af Amer 105 >59 mL/min/1.73   BUN/Creatinine Ratio 19 12 - 28   Sodium 139 134 - 144 mmol/L   Potassium 4.7 3.5 - 5.2 mmol/L   Chloride 102 96 - 106 mmol/L   CO2 18 (L) 20 - 29 mmol/L   Calcium 9.5 8.7 - 10.3 mg/dL  Hepatic function panel  Result Value Ref Range   Total Protein 7.3 6.0 - 8.5 g/dL   Albumin 4.3 3.8 - 4.8 g/dL   Bilirubin Total 0.3 0.0 - 1.2 mg/dL   Bilirubin, Direct 0.08 0.00 - 0.40 mg/dL   Alkaline Phosphatase 105 39 - 117 IU/L   AST 20 0 - 40 IU/L   ALT 18 0 - 32 IU/L  Lipid Profile  Result Value Ref Range   Cholesterol, Total 203 (H) 100 - 199 mg/dL   Triglycerides 205 (H) 0 - 149 mg/dL   HDL 62 >39 mg/dL   VLDL Cholesterol Cal 41 (H) 5 - 40 mg/dL   LDL Calculated 100 (H) 0 - 99 mg/dL   Chol/HDL Ratio 3.3 0.0 - 4.4 ratio

## 2019-06-24 ENCOUNTER — Telehealth: Payer: Self-pay | Admitting: Family Medicine

## 2019-06-24 DIAGNOSIS — E785 Hyperlipidemia, unspecified: Secondary | ICD-10-CM

## 2019-06-24 DIAGNOSIS — Z1159 Encounter for screening for other viral diseases: Secondary | ICD-10-CM

## 2019-06-24 DIAGNOSIS — R739 Hyperglycemia, unspecified: Secondary | ICD-10-CM

## 2019-06-24 DIAGNOSIS — Z79899 Other long term (current) drug therapy: Secondary | ICD-10-CM

## 2019-06-24 NOTE — Telephone Encounter (Signed)
Last labs 12/2018: Lipid, Liver and Met 7

## 2019-06-24 NOTE — Telephone Encounter (Signed)
Lipid, met 7, hepatitis C antibody per CDC guideline, hyperlipidemia

## 2019-06-24 NOTE — Telephone Encounter (Signed)
Blood work ordered in Epic. Patient notified. 

## 2019-06-24 NOTE — Telephone Encounter (Signed)
Has a 6 mos f/u scheduled in a couple weeks and wants to know if she needs labwork and if so needs an order for [facility].

## 2019-07-01 DIAGNOSIS — R739 Hyperglycemia, unspecified: Secondary | ICD-10-CM | POA: Diagnosis not present

## 2019-07-01 DIAGNOSIS — Z1159 Encounter for screening for other viral diseases: Secondary | ICD-10-CM | POA: Diagnosis not present

## 2019-07-01 DIAGNOSIS — E785 Hyperlipidemia, unspecified: Secondary | ICD-10-CM | POA: Diagnosis not present

## 2019-07-01 DIAGNOSIS — Z79899 Other long term (current) drug therapy: Secondary | ICD-10-CM | POA: Diagnosis not present

## 2019-07-02 LAB — LIPID PANEL
Chol/HDL Ratio: 2.3 ratio (ref 0.0–4.4)
Cholesterol, Total: 136 mg/dL (ref 100–199)
HDL: 59 mg/dL (ref >39)
LDL Chol Calc (NIH): 59 mg/dL (ref 0–99)
Triglycerides: 99 mg/dL (ref 0–149)
VLDL Cholesterol Cal: 18 mg/dL (ref 5–40)

## 2019-07-02 LAB — BASIC METABOLIC PANEL
BUN/Creatinine Ratio: 17 (ref 12–28)
BUN: 12 mg/dL (ref 8–27)
CO2: 22 mmol/L (ref 20–29)
Calcium: 9.3 mg/dL (ref 8.7–10.3)
Chloride: 102 mmol/L (ref 96–106)
Creatinine, Ser: 0.71 mg/dL (ref 0.57–1.00)
GFR calc Af Amer: 100 mL/min/{1.73_m2} (ref >59)
GFR calc non Af Amer: 87 mL/min/{1.73_m2} (ref >59)
Glucose: 86 mg/dL (ref 65–99)
Potassium: 4.9 mmol/L (ref 3.5–5.2)
Sodium: 138 mmol/L (ref 134–144)

## 2019-07-02 LAB — HEPATIC FUNCTION PANEL
ALT: 29 IU/L (ref 0–32)
AST: 29 IU/L (ref 0–40)
Albumin: 4.4 g/dL (ref 3.8–4.8)
Alkaline Phosphatase: 124 IU/L — ABNORMAL HIGH (ref 39–117)
Bilirubin Total: 0.6 mg/dL (ref 0.0–1.2)
Bilirubin, Direct: 0.18 mg/dL (ref 0.00–0.40)
Total Protein: 6.9 g/dL (ref 6.0–8.5)

## 2019-07-02 LAB — HEPATITIS C ANTIBODY: Hep C Virus Ab: <0.1 s/co ratio (ref 0.0–0.9)

## 2019-07-07 ENCOUNTER — Ambulatory Visit (INDEPENDENT_AMBULATORY_CARE_PROVIDER_SITE_OTHER): Payer: PPO | Admitting: Family Medicine

## 2019-07-07 ENCOUNTER — Other Ambulatory Visit: Payer: Self-pay

## 2019-07-07 DIAGNOSIS — E7849 Other hyperlipidemia: Secondary | ICD-10-CM | POA: Diagnosis not present

## 2019-07-07 DIAGNOSIS — M1612 Unilateral primary osteoarthritis, left hip: Secondary | ICD-10-CM

## 2019-07-07 MED ORDER — ATORVASTATIN CALCIUM 40 MG PO TABS: ORAL_TABLET | ORAL | 1 refills | Status: DC

## 2019-07-07 NOTE — Progress Notes (Signed)
Subjective:    Patient ID: Andrea Mays, female    DOB: Jun 18, 1951, 68 y.o.   MRN: [id number] Phone visit video was not possible Hyperlipidemia This is a chronic problem. Pertinent negatives include no chest pain or shortness of breath. There are no compliance problems.   Patient having significant arthritic issues.  Uses anti-inflammatories and Tylenol for this.  Denies high fever chills sweats wheezing difficulty breathing Pt is doing well. No issues at this time. Pt is taking medication as prescribed. Pt had labs done on 07/01/2019. Results for orders placed or performed in visit on 06/24/19  Lipid panel  Result Value Ref Range   Cholesterol, Total 136 100 - 199 mg/dL   Triglycerides 99 0 - 149 mg/dL   HDL 59 >39 mg/dL   VLDL Cholesterol Cal 18 5 - 40 mg/dL   LDL Chol Calc (NIH) 59 0 - 99 mg/dL   Chol/HDL Ratio 2.3 0.0 - 4.4 ratio  Basic metabolic panel  Result Value Ref Range   Glucose 86 65 - 99 mg/dL   BUN 12 8 - 27 mg/dL   Creatinine, Ser 0.71 0.57 - 1.00 mg/dL   GFR calc non Af Amer 87 >59 mL/min/1.73   GFR calc Af Amer 100 >59 mL/min/1.73   BUN/Creatinine Ratio 17 12 - 28   Sodium 138 134 - 144 mmol/L   Potassium 4.9 3.5 - 5.2 mmol/L   Chloride 102 96 - 106 mmol/L   CO2 22 20 - 29 mmol/L   Calcium 9.3 8.7 - 10.3 mg/dL  Hepatic function panel  Result Value Ref Range   Total Protein 6.9 6.0 - 8.5 g/dL   Albumin 4.4 3.8 - 4.8 g/dL   Bilirubin Total 0.6 0.0 - 1.2 mg/dL   Bilirubin, Direct 0.18 0.00 - 0.40 mg/dL   Alkaline Phosphatase 124 (H) 39 - 117 IU/L   AST 29 0 - 40 IU/L   ALT 29 0 - 32 IU/L  Hepatitis C Antibody  Result Value Ref Range   Hep C Virus Ab <0.1 0.0 - 0.9 s/co ratio    Virtual Visit via Video Note  I connected with Andrea Mays on 07/07/19 at  2:00 PM EDT by a video enabled telemedicine application and verified that I am speaking with the correct person using two identifiers.  Location: Patient: home Provider: office   I discussed the  limitations of evaluation and management by telemedicine and the availability of in person appointments. The patient expressed understanding and agreed to proceed.  History of Present Illness:    Observations/Objective:   Assessment and Plan:   Follow Up Instructions:    I discussed the assessment and treatment plan with the patient. The patient was provided an opportunity to ask questions and all were answered. The patient agreed with the plan and demonstrated an understanding of the instructions.   The patient was advised to call back or seek an in-person evaluation if the symptoms worsen or if the condition fails to improve as anticipated.  I provided 15 minutes of non-face-to-face time during this encounter.   Vicente Males, LPN    Review of Systems  Constitutional: Negative for activity change and appetite change.  HENT: Negative for congestion and rhinorrhea.   Respiratory: Negative for cough and shortness of breath.   Cardiovascular: Negative for chest pain and leg swelling.  Gastrointestinal: Negative for abdominal pain, nausea and vomiting.  Skin: Negative for color change.  Neurological: Negative for dizziness and weakness.  Psychiatric/Behavioral: Negative  for agitation and confusion.       Objective:   Physical Exam  Today's visit was via telephone Physical exam was not possible for this visit       Assessment & Plan:  Hyperlipidemia Very important for the patient to continue her medication Lab work overall looks good I reviewed this with the husband No questions currently If any ongoing troubles or problems notify us Follow-up 6 months

## 2019-08-08 ENCOUNTER — Other Ambulatory Visit: Payer: Self-pay | Admitting: Family Medicine

## 2019-08-08 DIAGNOSIS — Z1231 Encounter for screening mammogram for malignant neoplasm of breast: Secondary | ICD-10-CM

## 2019-09-02 ENCOUNTER — Other Ambulatory Visit: Payer: Self-pay

## 2019-09-03 ENCOUNTER — Other Ambulatory Visit: Payer: Self-pay

## 2019-09-03 ENCOUNTER — Ambulatory Visit (HOSPITAL_COMMUNITY)
Admission: RE | Admit: 2019-09-03 | Discharge: 2019-09-03 | Disposition: A | Discharge status: Home / Self Care | Payer: PPO | Source: Ambulatory Visit | Attending: Family Medicine | Admitting: Family Medicine

## 2019-09-03 DIAGNOSIS — Z1231 Encounter for screening mammogram for malignant neoplasm of breast: Secondary | ICD-10-CM | POA: Diagnosis not present

## 2019-11-12 ENCOUNTER — Ambulatory Visit: Payer: PPO

## 2019-11-28 DIAGNOSIS — H25813 Combined forms of age-related cataract, bilateral: Secondary | ICD-10-CM | POA: Diagnosis not present

## 2019-11-28 DIAGNOSIS — H524 Presbyopia: Secondary | ICD-10-CM | POA: Diagnosis not present

## 2019-11-28 DIAGNOSIS — H5203 Hypermetropia, bilateral: Secondary | ICD-10-CM | POA: Diagnosis not present

## 2020-01-23 ENCOUNTER — Ambulatory Visit (INDEPENDENT_AMBULATORY_CARE_PROVIDER_SITE_OTHER): Payer: PPO | Admitting: Family Medicine

## 2020-01-23 ENCOUNTER — Encounter: Payer: Self-pay | Admitting: Family Medicine

## 2020-01-23 ENCOUNTER — Other Ambulatory Visit: Payer: Self-pay

## 2020-01-23 VITALS — BP 124/80 | HR 84 | Temp 98.0°F | Resp 15 | Ht <= 58 in | Wt 106.0 lb

## 2020-01-23 DIAGNOSIS — M1612 Unilateral primary osteoarthritis, left hip: Secondary | ICD-10-CM | POA: Diagnosis not present

## 2020-01-23 DIAGNOSIS — M19042 Primary osteoarthritis, left hand: Secondary | ICD-10-CM

## 2020-01-23 DIAGNOSIS — M8580 Other specified disorders of bone density and structure, unspecified site: Secondary | ICD-10-CM | POA: Diagnosis not present

## 2020-01-23 DIAGNOSIS — M19041 Primary osteoarthritis, right hand: Secondary | ICD-10-CM | POA: Diagnosis not present

## 2020-01-23 DIAGNOSIS — E782 Mixed hyperlipidemia: Secondary | ICD-10-CM

## 2020-01-23 DIAGNOSIS — Z86010 Personal history of colonic polyps: Secondary | ICD-10-CM

## 2020-01-23 DIAGNOSIS — M810 Age-related osteoporosis without current pathological fracture: Secondary | ICD-10-CM | POA: Insufficient documentation

## 2020-01-23 HISTORY — DX: Other specified disorders of bone density and structure, unspecified site: M85.80

## 2020-01-23 MED ORDER — ATORVASTATIN CALCIUM 40 MG PO TABS: ORAL_TABLET | ORAL | 1 refills | Status: DC

## 2020-01-23 MED ORDER — DICLOFENAC SODIUM 1 % EX GEL: 2.0000 g | Freq: Four times a day (QID) | CUTANEOUS | 3 refills | Status: DC

## 2020-01-23 NOTE — Patient Instructions (Signed)
Please return in 6 months for your annual complete physical; please come fasting.  I have ordered voltaren gel for you to use on your hands and knees to help with your arthritis pain.   It was a pleasure meeting you today! Thank you for choosing [place] to meet your healthcare needs! I truly look forward to working with you. If you have any questions or concerns, please send me a message via Mychart or call the office at [phone].

## 2020-01-23 NOTE — Progress Notes (Signed)
Subjective  CC:  Chief Complaint  Patient presents with  . New Patient (Initial Visit)    HPI: Andrea Mays is a 69 y.o. female who presents to [place] [facility] at [place] today to establish care with me as a new patient.  Reviewed prior pcp notes and test resuts She has the following concerns or needs:  Pleasant married mother of 2 grown children and 3 grandchildren who relocated with husband and son to [place] from [place] about 2 years ago. Mostly healthy. Has OA in hands, knees and hips. HLD on statin and well tolerated. Nl labs reviewed from as recent as 09.2020. she has osteopenia overdue for f/u dexa. Next mammo due in November. Has colon polyps and sees Dr. Oneida Alar for surveillance.   No c/o outside of hand pain.   HM: imms up to date. Had covid series. Prefers to defer dexa until November with mammo. Will get in [place] from now on. (BC).  Assessment  1. Mixed hyperlipidemia   2. Primary osteoarthritis of both hands   3. Osteopenia, unspecified location   4. Primary osteoarthritis of left hip   5. Hx of adenomatous colonic polyps      Plan   HLD on statin with good control no changes.   OA; add voltaren gel. Prn oral advil as needed. No inflammation present on exam now  Will recheck dexa with mammo in nov  Colonoscopy surveillance due 2023  Follow up:  Return in about 6 months (around 07/24/2020) for complete physical, AWV at patient's convenience. No orders of the defined types were placed in this encounter.  Meds ordered this encounter  Medications  . atorvastatin (LIPITOR) 40 MG tablet    Sig: Take one tablet by mouth each day    Dispense:  90 tablet    Refill:  1  . diclofenac Sodium (VOLTAREN) 1 % GEL    Sig: Apply 2 g topically 4 (four) times daily. 2g for hands and 4g for knees    Dispense:  350 g    Refill:  3     Depression screen [facility] 2/9 12/24/2017  Decreased Interest 0  Down, Depressed, Hopeless 0  PHQ - 2 Score 0    We updated  and reviewed the patient's past history in detail and it is documented below.  Patient Active Problem List   Diagnosis Date Noted  . Osteoarthritis of both hands 01/23/2020  . Osteopenia 01/23/2020    dexa 2018    . Hx of adenomatous colonic polyps   . Osteoarthritis of left hip   . Mixed hyperlipidemia 07/24/2013    HDL very good offsets LDL    Health Maintenance  Topic Date Due  . TETANUS/TDAP  Never done  . DEXA SCAN  12/14/2018  . INFLUENZA VACCINE  05/09/2020  . MAMMOGRAM  09/02/2020  . COLONOSCOPY  09/07/2022  . Hepatitis C Screening  Completed  . PNA vac Low Risk Adult  Completed   Immunization History  Administered Date(s) Administered  . Influenza Split 07/24/2013  . Influenza,inj,Quad PF,6+ Mos 07/29/2014, 08/09/2015, 08/11/2016, 06/15/2017, 06/26/2018  . PFIZER SARS-COV-2 Vaccination 10/31/2019, 11/21/2019  . Pneumococcal Conjugate-13 08/09/2015  . Pneumococcal Polysaccharide-23 12/07/2016  . Zoster 05/09/2014   Current Meds  Medication Sig  . atorvastatin (LIPITOR) 40 MG tablet Take one tablet by mouth each day  . Calcium Carbonate-Vitamin D (CALTRATE 600+D PO) Take 1 tablet by mouth 2 (two) times daily.   . diclofenac (CATAFLAM) 50 MG tablet Take 1  tablet (50 mg total) by mouth 2 (two) times daily.  . Multiple Vitamins-Minerals (CENTRUM SILVER PO) Take 1 tablet by mouth daily.  . Omega-3 Fatty Acids (FISH OIL PO) Take 1 capsule by mouth daily.  Vladimir Faster Glycol-Propyl Glycol (SYSTANE OP) Apply 1 drop to eye daily as needed (dry eyes).  . [DISCONTINUED] atorvastatin (LIPITOR) 40 MG tablet Take one tablet by mouth each day  . [DISCONTINUED] naproxen (NAPROSYN) 500 MG tablet Take 500 mg by mouth daily.    Allergies: Patient has No Known Allergies. Past Medical History Patient  has a past medical history of No abnormality seen and Osteopenia (01/23/2020). Past Surgical History Patient  has a past surgical history that includes Total hip arthroplasty (Left,  11/25/2014); Colonoscopy (N/A, 09/07/2017); and polypectomy (09/07/2017). Family History: Patient family history is not on file. Social History:  Patient  reports that she has never smoked. She has never used smokeless tobacco. She reports that she does not drink alcohol or use drugs.  Review of Systems: Constitutional: negative for fever or malaise Ophthalmic: negative for photophobia, double vision or loss of vision Cardiovascular: negative for chest pain, dyspnea on exertion, or new LE swelling Respiratory: negative for SOB or persistent cough Gastrointestinal: negative for abdominal pain, change in bowel habits or melena Genitourinary: negative for dysuria or gross hematuria Musculoskeletal: negative for new gait disturbance or muscular weakness Integumentary: negative for new or persistent rashes Neurological: negative for TIA or stroke symptoms Psychiatric: negative for SI or delusions Allergic/Immunologic: negative for hives  Patient Care Team    Relationship Specialty Notifications Start End  Leamon Arnt, MD PCP - General Family Medicine  01/23/20     Objective  Vitals: BP 124/80   Pulse 84   Temp 98 F (36.7 C) (Temporal)   Resp 15   Ht 4\' 6"  (1.372 m)   Wt 106 lb (48.1 kg)   SpO2 97%   BMI 25.56 kg/m  General:  Well developed, well nourished, no acute distress  Psych:  Alert and oriented,normal mood and affect HEENT:  Normocephalic, atraumatic, non-icteric sclera, supple neck without adenopathy, mass or thyromegaly Cardiovascular:  RRR without gallop, rub or murmur Respiratory:  Good breath sounds bilaterally, CTAB with normal respiratory effort MSK: bilateral ;hand joints with oa changes w/o heat or redness or ttp. Bilateral knee crepitus w/o ttp or effusion Skin:  Warm, no rashes or suspicious lesions noted Neurologic:    Mental status is normal. Gross motor and sensory exams are normal. Normal gait   Commons side effects, risks, benefits, and alternatives  for medications and treatment plan prescribed today were discussed, and the patient expressed understanding of the given instructions. Patient is instructed to call or message via MyChart if he/she has any questions or concerns regarding our treatment plan. No barriers to understanding were identified. We discussed Red Flag symptoms and signs in detail. Patient expressed understanding regarding what to do in case of urgent or emergency type symptoms.   Medication list was reconciled, printed and provided to the patient in AVS. Patient instructions and summary information was reviewed with the patient as documented in the AVS. This note was prepared with assistance of Dragon voice recognition software. Occasional wrong-word or sound-a-like substitutions may have occurred due to the inherent limitations of voice recognition software  This visit occurred during the SARS-CoV-2 public health emergency.  Safety protocols were in place, including screening questions prior to the visit, additional usage of staff PPE, and extensive cleaning of exam room while observing  appropriate contact time as indicated for disinfecting solutions.

## 2020-07-26 ENCOUNTER — Other Ambulatory Visit: Payer: Self-pay | Admitting: Family Medicine

## 2020-07-26 ENCOUNTER — Encounter: Payer: PPO | Admitting: Family Medicine

## 2020-07-26 ENCOUNTER — Ambulatory Visit (INDEPENDENT_AMBULATORY_CARE_PROVIDER_SITE_OTHER): Payer: PPO

## 2020-07-26 ENCOUNTER — Encounter: Payer: Self-pay | Admitting: Family Medicine

## 2020-07-26 ENCOUNTER — Other Ambulatory Visit: Payer: Self-pay

## 2020-07-26 VITALS — BP 136/84 | HR 75 | Wt 104.2 lb

## 2020-07-26 DIAGNOSIS — Z23 Encounter for immunization: Secondary | ICD-10-CM | POA: Diagnosis not present

## 2020-07-26 DIAGNOSIS — Z1231 Encounter for screening mammogram for malignant neoplasm of breast: Secondary | ICD-10-CM

## 2020-07-26 DIAGNOSIS — Z0000 Encounter for general adult medical examination without abnormal findings: Secondary | ICD-10-CM | POA: Diagnosis not present

## 2020-07-26 NOTE — Progress Notes (Signed)
Pt stated for her next mammogram that she would like it at [facility] office.

## 2020-07-26 NOTE — Progress Notes (Signed)
Subjective:   Andrea Mays is a 69 y.o. female who presents for an Initial Medicare Annual Wellness Visit.  Review of Systems     Cardiac Risk Factors include: advanced age (>34men, >13 women);dyslipidemia     Objective:    Today's Vitals   07/26/20 0859  BP: 136/84  Pulse: 75  SpO2: 97%  Weight: 104 lb 3.2 oz (47.3 kg)   Body mass index is 25.12 kg/m.  Advanced Directives 07/26/2020 09/07/2017 11/25/2014 11/20/2014  Does Patient Have a Medical Advance Directive? No No No No  Would patient like information on creating a medical advance directive? No - Patient declined No - Patient declined No - patient declined information No - patient declined information    Current Medications (verified) Outpatient Encounter Medications as of 07/26/2020  Medication Sig  . atorvastatin (LIPITOR) 40 MG tablet Take one tablet by mouth each day  . Calcium Carbonate-Vitamin D (CALTRATE 600+D PO) Take 1 tablet by mouth 2 (two) times daily.   . diclofenac (CATAFLAM) 50 MG tablet Take 1 tablet (50 mg total) by mouth 2 (two) times daily.  . diclofenac Sodium (VOLTAREN) 1 % GEL Apply 2 g topically 4 (four) times daily. 2g for hands and 4g for knees  . Multiple Vitamins-Minerals (CENTRUM SILVER PO) Take 1 tablet by mouth daily.  . Omega-3 Fatty Acids (FISH OIL PO) Take 1 capsule by mouth daily.  Andrea Mays Glycol-Propyl Glycol (SYSTANE OP) Apply 1 drop to eye daily as needed (dry eyes).   No facility-administered encounter medications on file as of 07/26/2020.    Allergies (verified) Patient has no known allergies.   History: Past Medical History:  Diagnosis Date  . No abnormality seen   . Osteopenia 01/23/2020   dexa 2018    Past Surgical History:  Procedure Laterality Date  . COLONOSCOPY N/A 09/07/2017   Procedure: COLONOSCOPY;  Surgeon: Danie Binder, MD;  Location: [facility];  Service: Endoscopy;  Laterality: N/A;  1:30  . POLYPECTOMY  09/07/2017   Procedure: POLYPECTOMY;   Surgeon: Danie Binder, MD;  Location: [facility];  Service: Endoscopy;;  SIGMOID  . TOTAL HIP ARTHROPLASTY Left 11/25/2014   Procedure: LEFT TOTAL HIP ARTHROPLASTY;  Surgeon: Carole Civil, MD;  Location: [facility] ORS;  Service: Orthopedics;  Laterality: Left;   History reviewed. No pertinent family history. Social History   Socioeconomic History  . Marital status: Married    Spouse name: Not on file  . Number of children: Not on file  . Years of education: Not on file  . Highest education level: Not on file  Occupational History  . Occupation: Retired  Tobacco Use  . Smoking status: Never Smoker  . Smokeless tobacco: Never Used  Substance and Sexual Activity  . Alcohol use: No  . Drug use: No  . Sexual activity: Not Currently  Other Topics Concern  . Not on file  Social History Narrative  . Not on file   Social Determinants of Health   Financial Resource Strain: Low Risk   . Difficulty of Paying Living Expenses: Not hard at all  Food Insecurity: No Food Insecurity  . Worried About Charity fundraiser in the Last Year: Never true  . Ran Out of Food in the Last Year: Never true  Transportation Needs: No Transportation Needs  . Lack of Transportation (Medical): No  . Lack of Transportation (Non-Medical): No  Physical Activity: Insufficiently Active  . Days of Exercise per Week: 2 days  .  Minutes of Exercise per Session: 30 min  Stress: No Stress Concern Present  . Feeling of Stress : Not at all  Social Connections: Moderately Isolated  . Frequency of Communication with Friends and Family: More than three times a week  . Frequency of Social Gatherings with Friends and Family: Once a week  . Attends Religious Services: Never  . Active Member of Clubs or Organizations: No  . Attends Archivist Meetings: Never  . Marital Status: Married    Tobacco Counseling Counseling given: Not Answered   Clinical Intake:  Pre-visit preparation completed: No  Pain  : No/denies pain     BMI - recorded: 25.12 Nutritional Status: BMI 25 -29 Overweight Nutritional Risks: None Diabetes: No  How often do you need to have someone help you when you read instructions, pamphlets, or other written materials from your doctor or pharmacy?: 1 - Never  Diabetic?No  Interpreter Needed?: No  Information entered by :: Charlott Rakes ,LPN   Activities of Daily Living In your present state of health, do you have any difficulty performing the following activities: 07/26/2020 01/23/2020  Hearing? N N  Vision? Y N  Difficulty concentrating or making decisions? N N  Walking or climbing stairs? N N  Dressing or bathing? N N  Doing errands, shopping? N N  Preparing Food and eating ? N -  Using the Toilet? N -  In the past six months, have you accidently leaked urine? N -  Do you have problems with loss of bowel control? N -  Managing your Medications? N -  Managing your Finances? N -  Housekeeping or managing your Housekeeping? N -  Some recent data might be hidden    Patient Care Team: Leamon Arnt, MD as PCP - General (Family Medicine)  Indicate any recent Medical Services you may have received from other than [facility] providers in the past year (date may be approximate).     Assessment:   This is a routine wellness examination for Andrea Mays.  Hearing/Vision screen  Hearing Screening   125Hz  250Hz  500Hz  1000Hz  2000Hz  3000Hz  4000Hz  6000Hz  8000Hz   Right ear:           Left ear:           Comments: Denies any difficulty hearing at this time  Vision Screening Comments: Follows up with dr Gershon Crane for annual eye care   Dietary issues and exercise activities discussed: Current Exercise Habits: Home exercise routine, Time (Minutes): 30, Frequency (Times/Week): 2, Weekly Exercise (Minutes/Week): 60  Goals    . Patient Stated     More walking       Depression Screen PHQ 2/9 Scores 07/26/2020 12/24/2017  PHQ - 2 Score 0 0    Fall Risk Fall Risk   07/26/2020 01/23/2020 09/02/2019  Falls in the past year? 0 0 0  Comment - - Emmi Telephone Survey: data to providers prior to load  Number falls in past yr: 0 0 -  Injury with Fall? 0 0 -  Follow up Falls prevention discussed - -    Any stairs in or around the home? Yes  If so, are there any without handrails? No  Home free of loose throw rugs in walkways, pet beds, electrical cords, etc? Yes  Adequate lighting in your home to reduce risk of falls? Yes   ASSISTIVE DEVICES UTILIZED TO PREVENT FALLS:  Life alert? No  Use of a cane, walker or w/c? No  Grab bars in the bathroom? Yes  Shower chair or bench in shower? Yes  Elevated toilet seat or a handicapped toilet? No   TIMED UP AND GO:  Was the test performed? Yes .  Length of time to ambulate 10 feet: 10 sec.   Gait steady and fast without use of assistive device  Cognitive Function:     6CIT Screen 07/26/2020  What Year? 0 points  What month? 0 points  Count back from 20 (No Data)  Repeat phrase 2 points    Immunizations Immunization History  Administered Date(s) Administered  . Fluad Quad(high Dose 65+) 07/26/2020  . Influenza Split 07/24/2013  . Influenza, High Dose Seasonal PF 07/08/2019  . Influenza,inj,Quad PF,6+ Mos 07/29/2014, 08/09/2015, 08/11/2016, 06/15/2017, 06/26/2018  . PFIZER SARS-COV-2 Vaccination 10/31/2019, 11/21/2019  . Pneumococcal Conjugate-13 08/09/2015  . Pneumococcal Polysaccharide-23 12/07/2016  . Zoster 05/09/2014  . Zoster Recombinat (Shingrix) 07/23/2018, 09/25/2018    TDAP status: Due, Education has been provided regarding the importance of this vaccine. Advised may receive this vaccine at local pharmacy or Health Dept. Aware to provide a copy of the vaccination record if obtained from local pharmacy or Health Dept. Verbalized acceptance and understanding. Flu Vaccine status: Completed at today's visit Pneumococcal vaccine status: Up to date Covid-19 vaccine status: Completed  vaccines  Qualifies for Shingles Vaccine? Yes   Zostavax completed Yes   Shingrix Completed?: Yes  Screening Tests Health Maintenance  Topic Date Due  . TETANUS/TDAP  Never done  . DEXA SCAN  12/14/2018  . MAMMOGRAM  09/02/2020  . COLONOSCOPY  09/07/2022  . INFLUENZA VACCINE  Completed  . COVID-19 Vaccine  Completed  . Hepatitis C Screening  Completed  . PNA vac Low Risk Adult  Completed    Health Maintenance  Health Maintenance Due  Topic Date Due  . TETANUS/TDAP  Never done  . DEXA SCAN  12/14/2018    Colorectal cancer screening: Completed 09/07/17. Repeat every 5 years Mammogram status: Completed 09/03/19. Repeat every year Bone Density status: Completed 12/13/16. Results reflect: Bone density results: OSTEOPENIA. Repeat every 2 years.         Additional Screening:  Hepatitis C Screening:  Completed 07/01/19  Vision Screening: Recommended annual ophthalmology exams for early detection of glaucoma and other disorders of the eye. Is the patient up to date with their annual eye exam?  Yes  Who is the provider or what is the name of the office in which the patient attends annual eye exams? Dr Gershon Crane   Dental Screening: Recommended annual dental exams for proper oral hygiene  Community Resource Referral / Chronic Care Management: CRR required this visit?  No   CCM required this visit?  No      Plan:     I have personally reviewed and noted the following in the patient's chart:   . Medical and social history . Use of alcohol, tobacco or illicit drugs  . Current medications and supplements . Functional ability and status . Nutritional status . Physical activity . Advanced directives . List of other physicians . Hospitalizations, surgeries, and ER visits in previous 12 months . Vitals . Screenings to include cognitive, depression, and falls . Referrals and appointments  In addition, I have reviewed and discussed with patient certain preventive  protocols, quality metrics, and best practice recommendations. A written personalized care plan for preventive services as well as general preventive health recommendations were provided to patient.     Willette Brace, LPN   13/24/4010   Nurse Notes: None

## 2020-07-26 NOTE — Patient Instructions (Signed)
Ms. Andrea Mays , Thank you for taking time to come for your Medicare Wellness Visit. I appreciate your ongoing commitment to your health goals. Please review the following plan we discussed and let me know if I can assist you in the future.   Screening recommendations/referrals: Colonoscopy: Done 09/07/17 Mammogram: Done 09/03/19 Bone Density: Done 12/13/16 Recommended yearly ophthalmology/optometry visit for glaucoma screening and checkup Recommended yearly dental visit for hygiene and checkup  Vaccinations: Influenza vaccine: Given 07/26/20 at today's AWV  Pneumococcal vaccine: Up to date Tdap vaccine: Discussed  Shingles vac/cine: Completed 10/ 15 & 09/25/18 Covid-19:Completed 1/22 & 11/21/19/  Advanced directives: Advance directive discussed with you today. Even though you declined this today please call our office should you change your mind and we can give you the proper paperwork for you to fill out.  Conditions/risks identified: More walking  Next appointment: Follow up in one year for your annual wellness visit     Preventive Care 65 Years and Older, Female Preventive care refers to lifestyle choices and visits with your health care provider that can promote health and wellness. What does preventive care include?  A yearly physical exam. This is also called an annual well check.  Dental exams once or twice a year.  Routine eye exams. Ask your health care provider how often you should have your eyes checked.  Personal lifestyle choices, including:  Daily care of your teeth and gums.  Regular physical activity.  Eating a healthy diet.  Avoiding tobacco and drug use.  Limiting alcohol use.  Practicing safe sex.  Taking low-dose aspirin every day.  Taking vitamin and mineral supplements as recommended by your health care provider. What happens during an annual well check? The services and screenings done by your health care provider during your annual well check will  depend on your age, overall health, lifestyle risk factors, and family history of disease. Counseling  Your health care provider may ask you questions about your:  Alcohol use.  Tobacco use.  Drug use.  Emotional well-being.  Home and relationship well-being.  Sexual activity.  Eating habits.  History of falls.  Memory and ability to understand (cognition).  Work and work Statistician.  Reproductive health. Screening  You may have the following tests or measurements:  Height, weight, and BMI.  Blood pressure.  Lipid and cholesterol levels. These may be checked every 5 years, or more frequently if you are over 79 years old.  Skin check.  Lung cancer screening. You may have this screening every year starting at age 70 if you have a 30-pack-year history of smoking and currently smoke or have quit within the past 15 years.  Fecal occult blood test (FOBT) of the stool. You may have this test every year starting at age 68.  Flexible sigmoidoscopy or colonoscopy. You may have a sigmoidoscopy every 5 years or a colonoscopy every 10 years starting at age 22.  Hepatitis C blood test.  Hepatitis B blood test.  Sexually transmitted disease (STD) testing.  Diabetes screening. This is done by checking your blood sugar (glucose) after you have not eaten for a while (fasting). You may have this done every 1-3 years.  Bone density scan. This is done to screen for osteoporosis. You may have this done starting at age 8.  Mammogram. This may be done every 1-2 years. Talk to your health care provider about how often you should have regular mammograms. Talk with your health care provider about your test results, treatment options, and  if necessary, the need for more tests. Vaccines  Your health care provider may recommend certain vaccines, such as:  Influenza vaccine. This is recommended every year.  Tetanus, diphtheria, and acellular pertussis (Tdap, Td) vaccine. You may need a  Td booster every 10 years.  Zoster vaccine. You may need this after age 77.  Pneumococcal 13-valent conjugate (PCV13) vaccine. One dose is recommended after age 44.  Pneumococcal polysaccharide (PPSV23) vaccine. One dose is recommended after age 68. Talk to your health care provider about which screenings and vaccines you need and how often you need them. This information is not intended to replace advice given to you by your health care provider. Make sure you discuss any questions you have with your health care provider. Document Released: 10/22/2015 Document Revised: 06/14/2016 Document Reviewed: 07/27/2015 Elsevier Interactive Patient Education  2017 [place] Prevention in the Home Falls can cause injuries. They can happen to people of all ages. There are many things you can do to make your home safe and to help prevent falls. What can I do on the outside of my home?  Regularly fix the edges of walkways and driveways and fix any cracks.  Remove anything that might make you trip as you walk through a door, such as a raised step or threshold.  Trim any bushes or trees on the path to your home.  Use bright outdoor lighting.  Clear any walking paths of anything that might make someone trip, such as rocks or tools.  Regularly check to see if handrails are loose or broken. Make sure that both sides of any steps have handrails.  Any raised decks and porches should have guardrails on the edges.  Have any leaves, snow, or ice cleared regularly.  Use sand or salt on walking paths during winter.  Clean up any spills in your garage right away. This includes oil or grease spills. What can I do in the bathroom?  Use night lights.  Install grab bars by the toilet and in the tub and shower. Do not use towel bars as grab bars.  Use non-skid mats or decals in the tub or shower.  If you need to sit down in the shower, use a plastic, non-slip stool.  Keep the floor dry. Clean  up any water that spills on the floor as soon as it happens.  Remove soap buildup in the tub or shower regularly.  Attach bath mats securely with double-sided non-slip rug tape.  Do not have throw rugs and other things on the floor that can make you trip. What can I do in the bedroom?  Use night lights.  Make sure that you have a light by your bed that is easy to reach.  Do not use any sheets or blankets that are too big for your bed. They should not hang down onto the floor.  Have a firm chair that has side arms. You can use this for support while you get dressed.  Do not have throw rugs and other things on the floor that can make you trip. What can I do in the kitchen?  Clean up any spills right away.  Avoid walking on wet floors.  Keep items that you use a lot in easy-to-reach places.  If you need to reach something above you, use a strong step stool that has a grab bar.  Keep electrical cords out of the way.  Do not use floor polish or wax that makes floors slippery. If you  must use wax, use non-skid floor wax.  Do not have throw rugs and other things on the floor that can make you trip. What can I do with my stairs?  Do not leave any items on the stairs.  Make sure that there are handrails on both sides of the stairs and use them. Fix handrails that are broken or loose. Make sure that handrails are as long as the stairways.  Check any carpeting to make sure that it is firmly attached to the stairs. Fix any carpet that is loose or worn.  Avoid having throw rugs at the top or bottom of the stairs. If you do have throw rugs, attach them to the floor with carpet tape.  Make sure that you have a light switch at the top of the stairs and the bottom of the stairs. If you do not have them, ask someone to add them for you. What else can I do to help prevent falls?  Wear shoes that:  Do not have high heels.  Have rubber bottoms.  Are comfortable and fit you well.  Are  closed at the toe. Do not wear sandals.  If you use a stepladder:  Make sure that it is fully opened. Do not climb a closed stepladder.  Make sure that both sides of the stepladder are locked into place.  Ask someone to hold it for you, if possible.  Clearly mark and make sure that you can see:  Any grab bars or handrails.  First and last steps.  Where the edge of each step is.  Use tools that help you move around (mobility aids) if they are needed. These include:  Canes.  Walkers.  Scooters.  Crutches.  Turn on the lights when you go into a dark area. Replace any light bulbs as soon as they burn out.  Set up your furniture so you have a clear path. Avoid moving your furniture around.  If any of your floors are uneven, fix them.  If there are any pets around you, be aware of where they are.  Review your medicines with your doctor. Some medicines can make you feel dizzy. This can increase your chance of falling. Ask your doctor what other things that you can do to help prevent falls. This information is not intended to replace advice given to you by your health care provider. Make sure you discuss any questions you have with your health care provider. Document Released: 07/22/2009 Document Revised: 03/02/2016 Document Reviewed: 10/30/2014 Elsevier Interactive Patient Education  2017 [employer].

## 2020-08-03 ENCOUNTER — Encounter: Payer: PPO | Admitting: Family Medicine

## 2020-08-11 ENCOUNTER — Ambulatory Visit: Payer: PPO | Admitting: Family Medicine

## 2020-08-31 ENCOUNTER — Ambulatory Visit (INDEPENDENT_AMBULATORY_CARE_PROVIDER_SITE_OTHER): Payer: PPO | Admitting: Family Medicine

## 2020-08-31 ENCOUNTER — Encounter: Payer: Self-pay | Admitting: Family Medicine

## 2020-08-31 ENCOUNTER — Other Ambulatory Visit: Payer: Self-pay

## 2020-08-31 VITALS — BP 158/98 | HR 79 | Temp 98.8°F | Ht <= 58 in | Wt 105.8 lb

## 2020-08-31 DIAGNOSIS — L659 Nonscarring hair loss, unspecified: Secondary | ICD-10-CM | POA: Diagnosis not present

## 2020-08-31 DIAGNOSIS — M19041 Primary osteoarthritis, right hand: Secondary | ICD-10-CM

## 2020-08-31 DIAGNOSIS — Z86010 Personal history of colonic polyps: Secondary | ICD-10-CM | POA: Diagnosis not present

## 2020-08-31 DIAGNOSIS — R030 Elevated blood-pressure reading, without diagnosis of hypertension: Secondary | ICD-10-CM

## 2020-08-31 DIAGNOSIS — E782 Mixed hyperlipidemia: Secondary | ICD-10-CM

## 2020-08-31 DIAGNOSIS — M1612 Unilateral primary osteoarthritis, left hip: Secondary | ICD-10-CM | POA: Diagnosis not present

## 2020-08-31 DIAGNOSIS — M19042 Primary osteoarthritis, left hand: Secondary | ICD-10-CM | POA: Diagnosis not present

## 2020-08-31 DIAGNOSIS — Z780 Asymptomatic menopausal state: Secondary | ICD-10-CM | POA: Diagnosis not present

## 2020-08-31 DIAGNOSIS — Z0000 Encounter for general adult medical examination without abnormal findings: Secondary | ICD-10-CM | POA: Diagnosis not present

## 2020-08-31 DIAGNOSIS — M8580 Other specified disorders of bone density and structure, unspecified site: Secondary | ICD-10-CM

## 2020-08-31 NOTE — Progress Notes (Signed)
Subjective  Chief Complaint  Patient presents with  . Annual Exam  . Hyperlipidemia  . Osteoarthritis    HPI: Andrea Mays is a 69 y.o. female who presents to [facility] today for a Female Wellness Visit. She also has the concerns and/or needs as listed above in the chief complaint. These will be addressed in addition to the Health Maintenance Visit.   Wellness Visit: annual visit with health maintenance review and exam without Pap   Health maintenance: Has mammogram scheduled for next week.  Did bone density to follow-up osteopenia.  She does take calcium and vitamin D.  She has no complaints.  Feeling well.  Chronic disease f/u and/or acute problem visit: (deemed necessary to be done in addition to the wellness visit):  Hyperlipidemia on statin: Fasting for recheck.  Tolerates well.  No myalgias.  No history of hypertension but blood pressure is elevated in the office today.  She denies stressors.  Feels well.  No pain.  Osteoarthritis in hands and hip: Using Voltaren gel on her hands which is helpful.  Complains of hair thinning and hair loss over the last year.  No rash, flaking, scalp itching.  No new products.  Assessment  1. Annual physical exam   2. Osteopenia, unspecified location   3. Mixed hyperlipidemia   4. Primary osteoarthritis of both hands   5. Primary osteoarthritis of left hip   6. Hx of adenomatous colonic polyps   7. Asymptomatic menopausal state   8. Elevated blood pressure reading without diagnosis of hypertension   9. Hair loss      Plan  Female Wellness Visit:  Age appropriate Health Maintenance and Prevention measures were discussed with patient. Included topics are cancer screening recommendations, ways to keep healthy (see AVS) including dietary and exercise recommendations, regular eye and dental care, use of seat belts, and avoidance of moderate alcohol use and tobacco use.  Mammogram and bone density  scheduled.  BMI: discussed patient's BMI and encouraged positive lifestyle modifications to help get to or maintain a target BMI.  HM needs and immunizations were addressed and ordered. See below for orders. See HM and immunization section for updates.  Up-to-date  Routine labs and screening tests ordered including cmp, cbc and lipids where appropriate.  Discussed recommendations regarding Vit D and calcium supplementation (see AVS)  Chronic disease management visit and/or acute problem visit:  Elevated blood pressure even on recheck.  Will start home blood pressure monitoring and recheck in 4 weeks.  If remains elevated will start treatment for hypertension.  Education given.  Recheck lipid panel and LFTs on statin.  Check thyroid.  Consider dermatology referral for hair thinning.  Consider Rogaine   Follow up: Return in about 4 weeks (around 09/28/2020) for recheck blood pressure.  Orders Placed This Encounter  Procedures  . DG Bone Density  . CBC with Differential/Platelet  . COMPLETE METABOLIC PANEL WITH GFR  . Lipid panel  . TSH   No orders of the defined types were placed in this encounter.     Lifestyle: Body mass index is 25.51 kg/m. Wt Readings from Last 3 Encounters:  08/31/20 105 lb 12.8 oz (48 kg)  07/26/20 104 lb 3.2 oz (47.3 kg)  01/23/20 106 lb (48.1 kg)    Patient Active Problem List   Diagnosis Date Noted  . Osteoarthritis of both hands 01/23/2020  . Osteopenia 01/23/2020    dexa 2018    . Hx of adenomatous colonic  polyps   . Osteoarthritis of left hip   . Mixed hyperlipidemia 07/24/2013    HDL very good offsets LDL    Health Maintenance  Topic Date Due  . TETANUS/TDAP  Never done  . DEXA SCAN  12/14/2018  . MAMMOGRAM  09/02/2020  . COLONOSCOPY  09/07/2022  . INFLUENZA VACCINE  Completed  . COVID-19 Vaccine  Completed  . Hepatitis C Screening  Completed  . PNA vac Low Risk Adult  Completed   Immunization History  Administered Date(s)  Administered  . Fluad Quad(high Dose 65+) 07/26/2020  . Influenza Split 07/24/2013  . Influenza, High Dose Seasonal PF 07/08/2019  . Influenza,inj,Quad PF,6+ Mos 07/29/2014, 08/09/2015, 08/11/2016, 06/15/2017, 06/26/2018  . PFIZER SARS-COV-2 Vaccination 10/31/2019, 11/21/2019  . Pneumococcal Conjugate-13 08/09/2015  . Pneumococcal Polysaccharide-23 12/07/2016  . Zoster 05/09/2014  . Zoster Recombinat (Shingrix) 07/23/2018, 09/25/2018   We updated and reviewed the patient's past history in detail and it is documented below. Allergies: Patient has No Known Allergies. Past Medical History Patient  has a past medical history of No abnormality seen and Osteopenia (01/23/2020). Past Surgical History Patient  has a past surgical history that includes Total hip arthroplasty (Left, 11/25/2014); Colonoscopy (N/A, 09/07/2017); and polypectomy (09/07/2017). Family History: Patient family history is not on file. Social History:  Patient  reports that she has never smoked. She has never used smokeless tobacco. She reports that she does not drink alcohol and does not use drugs.  Review of Systems: Constitutional: negative for fever or malaise Ophthalmic: negative for photophobia, double vision or loss of vision Cardiovascular: negative for chest pain, dyspnea on exertion, or new LE swelling Respiratory: negative for SOB or persistent cough Gastrointestinal: negative for abdominal pain, change in bowel habits or melena Genitourinary: negative for dysuria or gross hematuria, no abnormal uterine bleeding or disharge Musculoskeletal: negative for new gait disturbance or muscular weakness Integumentary: negative for new or persistent rashes, no breast lumps Neurological: negative for TIA or stroke symptoms Psychiatric: negative for SI or delusions Allergic/Immunologic: negative for hives  Patient Care Team    Relationship Specialty Notifications Start End  Leamon Arnt, MD PCP - General Family  Medicine  01/23/20     Objective  Vitals: BP (!) 158/98   Pulse 79   Temp 98.8 F (37.1 C) (Temporal)   Ht 4\' 6"  (1.372 m)   Wt 105 lb 12.8 oz (48 kg)   SpO2 99%   BMI 25.51 kg/m  General:  Well developed, well nourished, no acute distress  Psych:  Alert and orientedx3,normal mood and affect HEENT:  Normocephalic, atraumatic, non-icteric sclera,  supple neck without adenopathy, mass or thyromegaly Cardiovascular:  Normal S1, S2, RRR without gallop, rub or murmur, distal pulses +2 bilaterally Respiratory:  Good breath sounds bilaterally, CTAB with normal respiratory effort Gastrointestinal: normal bowel sounds, soft, non-tender, no noted masses. No HSM MSK: no osteoarthritic changes bilateral hands, no contusions. Joints are without erythema or swelling.  Skin:  Warm, no rashes or suspicious lesions noted, scalp is clear.  Occipital region with new hair growth.  Hair thinning present. Neurologic:    Mental status is normal. CN 2-11 are normal. Gross motor and sensory exams are normal. Normal gait. No tremor   Commons side effects, risks, benefits, and alternatives for medications and treatment plan prescribed today were discussed, and the patient expressed understanding of the given instructions. Patient is instructed to call or message via MyChart if he/she has any questions or concerns regarding our treatment plan. No  barriers to understanding were identified. We discussed Red Flag symptoms and signs in detail. Patient expressed understanding regarding what to do in case of urgent or emergency type symptoms.   Medication list was reconciled, printed and provided to the patient in AVS. Patient instructions and summary information was reviewed with the patient as documented in the AVS. This note was prepared with assistance of Dragon voice recognition software. Occasional wrong-word or sound-a-like substitutions may have occurred due to the inherent limitations of voice recognition  software  This visit occurred during the SARS-CoV-2 public health emergency.  Safety protocols were in place, including screening questions prior to the visit, additional usage of staff PPE, and extensive cleaning of exam room while observing appropriate contact time as indicated for disinfecting solutions.

## 2020-08-31 NOTE — Patient Instructions (Addendum)
Please return in 1 month to recheck your blood pressure.   Please buy a blood pressure cuff and start checking your blood pressure at home Write down the numbers for me.  We want it < 140/90.  I will release your lab results to you on your MyChart account with further instructions. Please reply with any questions.   If you have any questions or concerns, please don't hesitate to send me a message via MyChart or call the office at [phone]. Thank you for visiting with [place] today! It's our pleasure caring for you.

## 2020-09-01 LAB — CBC WITH DIFFERENTIAL/PLATELET
Absolute Monocytes: 684 cells/uL (ref 200–950)
Basophils Absolute: 42 cells/uL (ref 0–200)
Basophils Relative: 0.7 %
Eosinophils Absolute: 120 cells/uL (ref 15–500)
Eosinophils Relative: 2 %
HCT: 44.4 % (ref 35.0–45.0)
Hemoglobin: 14.9 g/dL (ref 11.7–15.5)
Lymphs Abs: 1278 cells/uL (ref 850–3900)
MCH: 29.2 pg (ref 27.0–33.0)
MCHC: 33.6 g/dL (ref 32.0–36.0)
MCV: 86.9 fL (ref 80.0–100.0)
MPV: 11.1 fL (ref 7.5–12.5)
Monocytes Relative: 11.4 %
Neutro Abs: 3876 cells/uL (ref 1500–7800)
Neutrophils Relative %: 64.6 %
Platelets: 327 10*3/uL (ref 140–400)
RBC: 5.11 10*6/uL — ABNORMAL HIGH (ref 3.80–5.10)
RDW: 12.5 % (ref 11.0–15.0)
Total Lymphocyte: 21.3 %
WBC: 6 10*3/uL (ref 3.8–10.8)

## 2020-09-01 LAB — LIPID PANEL
Cholesterol: 147 mg/dL (ref <200)
HDL: 74 mg/dL (ref >=50)
LDL Cholesterol (Calc): 53 mg/dL (calc)
Non-HDL Cholesterol (Calc): 73 mg/dL (calc) (ref <130)
Total CHOL/HDL Ratio: 2 (calc) (ref <5.0)
Triglycerides: 115 mg/dL (ref <150)

## 2020-09-01 LAB — COMPLETE METABOLIC PANEL WITH GFR
AG Ratio: 1.4 (calc) (ref 1.0–2.5)
ALT: 34 U/L — ABNORMAL HIGH (ref 6–29)
AST: 28 U/L (ref 10–35)
Albumin: 4.2 g/dL (ref 3.6–5.1)
Alkaline phosphatase (APISO): 93 U/L (ref 37–153)
BUN: 13 mg/dL (ref 7–25)
CO2: 27 mmol/L (ref 20–32)
Calcium: 9.5 mg/dL (ref 8.6–10.4)
Chloride: 99 mmol/L (ref 98–110)
Creat: 0.64 mg/dL (ref 0.60–0.93)
GFR, Est African American: 104 mL/min/{1.73_m2} (ref >=60)
GFR, Est Non African American: 90 mL/min/{1.73_m2} (ref >=60)
Globulin: 3 g/dL (calc) (ref 1.9–3.7)
Glucose, Bld: 81 mg/dL (ref 65–99)
Potassium: 4.4 mmol/L (ref 3.5–5.3)
Sodium: 136 mmol/L (ref 135–146)
Total Bilirubin: 0.6 mg/dL (ref 0.2–1.2)
Total Protein: 7.2 g/dL (ref 6.1–8.1)

## 2020-09-01 LAB — TSH: TSH: 1.65 mIU/L (ref 0.40–4.50)

## 2020-09-07 ENCOUNTER — Ambulatory Visit
Admission: RE | Admit: 2020-09-07 | Discharge: 2020-09-07 | Disposition: A | Discharge status: Home / Self Care | Payer: PPO | Source: Ambulatory Visit | Attending: Family Medicine | Admitting: Family Medicine

## 2020-09-07 ENCOUNTER — Other Ambulatory Visit: Payer: Self-pay

## 2020-09-07 DIAGNOSIS — Z1231 Encounter for screening mammogram for malignant neoplasm of breast: Secondary | ICD-10-CM

## 2020-10-15 ENCOUNTER — Encounter: Payer: Self-pay | Admitting: Family Medicine

## 2020-10-15 ENCOUNTER — Other Ambulatory Visit: Payer: Self-pay

## 2020-10-15 ENCOUNTER — Ambulatory Visit (INDEPENDENT_AMBULATORY_CARE_PROVIDER_SITE_OTHER): Payer: PPO | Admitting: Family Medicine

## 2020-10-15 VITALS — BP 150/108 | HR 95 | Temp 98.2°F | Wt 106.0 lb

## 2020-10-15 DIAGNOSIS — I10 Essential (primary) hypertension: Secondary | ICD-10-CM | POA: Diagnosis not present

## 2020-10-15 MED ORDER — AMLODIPINE BESYLATE 5 MG PO TABS: 5.0000 mg | ORAL_TABLET | Freq: Every day | ORAL | 3 refills | Status: DC

## 2020-10-15 NOTE — Progress Notes (Signed)
Subjective  CC:  Chief Complaint  Patient presents with  . Elevated blood pressure reading without diagnosis of hypert    140-145/80-90 AM readings, 120's/80's PM readings - husband informed, did not write readings down     HPI: Andrea Mays is a 70 y.o. female who presents to the office today to address the problems listed above in the chief complaint.  Hypertension f/u: Here for follow-up due to elevated blood pressure in the office.  Husband has been taking blood pressures twice daily.  A.m. readings are consistently elevated from 140s to 80s over 90s.  Evening readings are always improved.  Patient continues to feel fine.  No history of hypertension.  No chest pain or shortness of breath.  No lower extremity edema.  She has no secondary diagnoses that would push what type of pressure medicine over the other.  Assessment  1. Essential hypertension      Plan    Hypertension f/u: New diagnosis.  Worse in the mornings.  Recommend low-dose amlodipine.  Start taking at bedtime.  Continue twice daily monitoring of blood pressures.  Monitor for hypotension.  Recheck in 3 months. Education regarding management of these chronic disease states was given. Management strategies discussed on successive visits include dietary and exercise recommendations, goals of achieving and maintaining IBW, and lifestyle modifications aiming for adequate sleep and minimizing stressors.   Follow up: 3 months for hypertension  No orders of the defined types were placed in this encounter.  Meds ordered this encounter  Medications  . amLODipine (NORVASC) 5 MG tablet    Sig: Take 1 tablet (5 mg total) by mouth at bedtime.    Dispense:  90 tablet    Refill:  3      BP Readings from Last 3 Encounters:  10/15/20 (!) 150/108  08/31/20 (!) 158/98  07/26/20 136/84   Wt Readings from Last 3 Encounters:  10/15/20 106 lb (48.1 kg)  08/31/20 105 lb 12.8 oz (48 kg)  07/26/20 104 lb 3.2 oz (47.3 kg)     Lab Results  Component Value Date   CHOL 147 08/31/2020   CHOL 136 07/01/2019   CHOL 203 (H) 12/18/2018   Lab Results  Component Value Date   HDL 74 08/31/2020   HDL 59 07/01/2019   HDL 62 12/18/2018   Lab Results  Component Value Date   LDLCALC 53 08/31/2020   LDLCALC 59 07/01/2019   LDLCALC 100 (H) 12/18/2018   Lab Results  Component Value Date   TRIG 115 08/31/2020   TRIG 99 07/01/2019   TRIG 205 (H) 12/18/2018   Lab Results  Component Value Date   CHOLHDL 2.0 08/31/2020   CHOLHDL 2.3 07/01/2019   CHOLHDL 3.3 12/18/2018   No results found for: LDLDIRECT Lab Results  Component Value Date   CREATININE 0.64 08/31/2020   BUN 13 08/31/2020   NA 136 08/31/2020   K 4.4 08/31/2020   CL 99 08/31/2020   CO2 27 08/31/2020    The 10-year ASCVD risk score Mikey Bussing DC Jr., et al., 2013) is: 16.7%   Values used to calculate the score:     Age: 53 years     Sex: Female     Is Non-Hispanic African American: No     Diabetic: No     Tobacco smoker: No     Systolic Blood Pressure: 098 mmHg     Is BP treated: Yes     HDL Cholesterol: 74 mg/dL  Total Cholesterol: 147 mg/dL  I reviewed the patients updated PMH, FH, and SocHx.    Patient Active Problem List   Diagnosis Date Noted  . Osteoarthritis of both hands 01/23/2020  . Osteopenia 01/23/2020  . Hx of adenomatous colonic polyps   . Osteoarthritis of left hip   . Mixed hyperlipidemia 07/24/2013    Allergies: Patient has no known allergies.  Social History: Patient  reports that she has never smoked. She has never used smokeless tobacco. She reports that she does not drink alcohol and does not use drugs.  Current Meds  Medication Sig  . amLODipine (NORVASC) 5 MG tablet Take 1 tablet (5 mg total) by mouth at bedtime.  Marland Kitchen atorvastatin (LIPITOR) 40 MG tablet Take 1 tablet by mouth once daily  . Calcium Carbonate-Vitamin D (CALTRATE 600+D PO) Take 1 tablet by mouth 2 (two) times daily.   . diclofenac Sodium  (VOLTAREN) 1 % GEL Apply 2 g topically 4 (four) times daily. 2g for hands and 4g for knees  . Multiple Vitamins-Minerals (CENTRUM SILVER PO) Take 1 tablet by mouth daily.  . Omega-3 Fatty Acids (FISH OIL PO) Take 1 capsule by mouth daily.  Vladimir Faster Glycol-Propyl Glycol (SYSTANE OP) Apply 1 drop to eye daily as needed (dry eyes).    Review of Systems: Cardiovascular: negative for chest pain, palpitations, leg swelling, orthopnea Respiratory: negative for SOB, wheezing or persistent cough Gastrointestinal: negative for abdominal pain Genitourinary: negative for dysuria or gross hematuria  Objective  Vitals: BP (!) 150/108   Pulse 95   Temp 98.2 F (36.8 C) (Temporal)   Wt 106 lb (48.1 kg)   SpO2 98%   BMI 25.56 kg/m  General: no acute distress  Cardiovascular: Regular rate and rhythm without murmur Lungs clear to auscultation  Commons side effects, risks, benefits, and alternatives for medications and treatment plan prescribed today were discussed, and the patient expressed understanding of the given instructions. Patient is instructed to call or message via MyChart if he/she has any questions or concerns regarding our treatment plan. No barriers to understanding were identified. We discussed Red Flag symptoms and signs in detail. Patient expressed understanding regarding what to do in case of urgent or emergency type symptoms.   Medication list was reconciled, printed and provided to the patient in AVS. Patient instructions and summary information was reviewed with the patient as documented in the AVS. This note was prepared with assistance of Dragon voice recognition software. Occasional wrong-word or sound-a-like substitutions may have occurred due to the inherent limitations of voice recognition software  This visit occurred during the SARS-CoV-2 public health emergency.  Safety protocols were in place, including screening questions prior to the visit, additional usage of staff  PPE, and extensive cleaning of exam room while observing appropriate contact time as indicated for disinfecting solutions.

## 2020-10-15 NOTE — Patient Instructions (Signed)
Please return in 3 months to recheck blood pressures.  Continue to check readings at home. Next visit, bring in your bp cuff with you to the visit.   If you have any questions or concerns, please don't hesitate to send me a message via MyChart or call the office at [phone]. Thank you for visiting with Andrea Mays today! It's our pleasure caring for you.   Hypertension, Adult High blood pressure (hypertension) is when the force of blood pumping through the arteries is too strong. The arteries are the blood vessels that carry blood from the heart throughout the body. Hypertension forces the heart to work harder to pump blood and may cause arteries to become narrow or stiff. Untreated or uncontrolled hypertension can cause a heart attack, heart failure, a stroke, kidney disease, and other problems. A blood pressure reading consists of a higher number over a lower number. Ideally, your blood pressure should be below 120/80. The first ("top") number is called the systolic pressure. It is a measure of the pressure in your arteries as your heart beats. The second ("bottom") number is called the diastolic pressure. It is a measure of the pressure in your arteries as the heart relaxes. What are the causes? The exact cause of this condition is not known. There are some conditions that result in or are related to high blood pressure. What increases the risk? Some risk factors for high blood pressure are under your control. The following factors may make you more likely to develop this condition:  Smoking.  Having type 2 diabetes mellitus, high cholesterol, or both.  Not getting enough exercise or physical activity.  Being overweight.  Having too much fat, sugar, calories, or salt (sodium) in your diet.  Drinking too much alcohol. Some risk factors for high blood pressure may be difficult or impossible to change. Some of these factors include:  Having chronic kidney disease.  Having a family history of  high blood pressure.  Age. Risk increases with age.  Race. You may be at higher risk if you are African American.  Gender. Men are at higher risk than women before age 2. After age 33, women are at higher risk than men.  Having obstructive sleep apnea.  Stress. What are the signs or symptoms? High blood pressure may not cause symptoms. Very high blood pressure (hypertensive crisis) may cause:  Headache.  Anxiety.  Shortness of breath.  Nosebleed.  Nausea and vomiting.  Vision changes.  Severe chest pain.  Seizures. How is this diagnosed? This condition is diagnosed by measuring your blood pressure while you are seated, with your arm resting on a flat surface, your legs uncrossed, and your feet flat on the floor. The cuff of the blood pressure monitor will be placed directly against the skin of your upper arm at the level of your heart. It should be measured at least twice using the same arm. Certain conditions can cause a difference in blood pressure between your right and left arms. Certain factors can cause blood pressure readings to be lower or higher than normal for a short period of time:  When your blood pressure is higher when you are in a health care provider's office than when you are at home, this is called white coat hypertension. Most people with this condition do not need medicines.  When your blood pressure is higher at home than when you are in a health care provider's office, this is called masked hypertension. Most people with this condition may need  medicines to control blood pressure. If you have a high blood pressure reading during one visit or you have normal blood pressure with other risk factors, you may be asked to:  Return on a different day to have your blood pressure checked again.  Monitor your blood pressure at home for 1 week or longer. If you are diagnosed with hypertension, you may have other blood or imaging tests to help your health care  provider understand your overall risk for other conditions. How is this treated? This condition is treated by making healthy lifestyle changes, such as eating healthy foods, exercising more, and reducing your alcohol intake. Your health care provider may prescribe medicine if lifestyle changes are not enough to get your blood pressure under control, and if:  Your systolic blood pressure is above 130.  Your diastolic blood pressure is above 80. Your personal target blood pressure may vary depending on your medical conditions, your age, and other factors. Follow these instructions at home: Eating and drinking   Eat a diet that is high in fiber and potassium, and low in sodium, added sugar, and fat. An example eating plan is called the DASH (Dietary Approaches to Stop Hypertension) diet. To eat this way: ? Eat plenty of fresh fruits and vegetables. Try to fill one half of your plate at each meal with fruits and vegetables. ? Eat whole grains, such as whole-wheat pasta, brown rice, or whole-grain bread. Fill about one fourth of your plate with whole grains. ? Eat or drink low-fat dairy products, such as skim milk or low-fat yogurt. ? Avoid fatty cuts of meat, processed or cured meats, and poultry with skin. Fill about one fourth of your plate with lean proteins, such as fish, chicken without skin, beans, eggs, or tofu. ? Avoid pre-made and processed foods. These tend to be higher in sodium, added sugar, and fat.  Reduce your daily sodium intake. Most people with hypertension should eat less than 1,500 mg of sodium a day.  Do not drink alcohol if: ? Your health care provider tells you not to drink. ? You are pregnant, may be pregnant, or are planning to become pregnant.  If you drink alcohol: ? Limit how much you use to:  0-1 drink a day for women.  0-2 drinks a day for men. ? Be aware of how much alcohol is in your drink. In the [place] one drink equals one 12 oz bottle of beer (355 mL), one  5 oz glass of wine (148 mL), or one 1 oz glass of hard liquor (44 mL). Lifestyle   Work with your health care provider to maintain a healthy body weight or to lose weight. Ask what an ideal weight is for you.  Get at least 30 minutes of exercise most days of the week. Activities may include walking, swimming, or biking.  Include exercise to strengthen your muscles (resistance exercise), such as Pilates or lifting weights, as part of your weekly exercise routine. Try to do these types of exercises for 30 minutes at least 3 days a week.  Do not use any products that contain nicotine or tobacco, such as cigarettes, e-cigarettes, and chewing tobacco. If you need help quitting, ask your health care provider.  Monitor your blood pressure at home as told by your health care provider.  Keep all follow-up visits as told by your health care provider. This is important. Medicines  Take over-the-counter and prescription medicines only as told by your health care provider. Follow directions carefully.  Blood pressure medicines must be taken as prescribed.  Do not skip doses of blood pressure medicine. Doing this puts you at risk for problems and can make the medicine less effective.  Ask your health care provider about side effects or reactions to medicines that you should watch for. Contact a health care provider if you:  Think you are having a reaction to a medicine you are taking.  Have headaches that keep coming back (recurring).  Feel dizzy.  Have swelling in your ankles.  Have trouble with your vision. Get help right away if you:  Develop a severe headache or confusion.  Have unusual weakness or numbness.  Feel faint.  Have severe pain in your chest or abdomen.  Vomit repeatedly.  Have trouble breathing. Summary  Hypertension is when the force of blood pumping through your arteries is too strong. If this condition is not controlled, it may put you at risk for serious  complications.  Your personal target blood pressure may vary depending on your medical conditions, your age, and other factors. For most people, a normal blood pressure is less than 120/80.  Hypertension is treated with lifestyle changes, medicines, or a combination of both. Lifestyle changes include losing weight, eating a healthy, low-sodium diet, exercising more, and limiting alcohol. This information is not intended to replace advice given to you by your health care provider. Make sure you discuss any questions you have with your health care provider. Document Revised: 06/05/2018 Document Reviewed: 06/05/2018 Elsevier Patient Education  2020 [employer].

## 2020-10-20 ENCOUNTER — Other Ambulatory Visit: Payer: Self-pay | Admitting: Family Medicine

## 2020-10-21 MED ORDER — ATORVASTATIN CALCIUM 40 MG PO TABS: ORAL_TABLET | ORAL | 0 refills | Status: DC

## 2020-11-02 ENCOUNTER — Other Ambulatory Visit: Payer: Self-pay | Admitting: Family Medicine

## 2020-11-17 DIAGNOSIS — Z780 Asymptomatic menopausal state: Secondary | ICD-10-CM | POA: Diagnosis not present

## 2020-11-17 DIAGNOSIS — M810 Age-related osteoporosis without current pathological fracture: Secondary | ICD-10-CM | POA: Diagnosis not present

## 2020-11-17 DIAGNOSIS — M8589 Other specified disorders of bone density and structure, multiple sites: Secondary | ICD-10-CM | POA: Diagnosis not present

## 2020-11-17 LAB — HM DEXA SCAN

## 2020-11-30 DIAGNOSIS — H5203 Hypermetropia, bilateral: Secondary | ICD-10-CM | POA: Diagnosis not present

## 2020-11-30 DIAGNOSIS — H524 Presbyopia: Secondary | ICD-10-CM | POA: Diagnosis not present

## 2020-11-30 DIAGNOSIS — H2513 Age-related nuclear cataract, bilateral: Secondary | ICD-10-CM | POA: Diagnosis not present

## 2020-11-30 DIAGNOSIS — H25013 Cortical age-related cataract, bilateral: Secondary | ICD-10-CM | POA: Diagnosis not present

## 2020-12-03 ENCOUNTER — Encounter: Payer: Self-pay | Admitting: Family Medicine

## 2021-01-18 ENCOUNTER — Ambulatory Visit (INDEPENDENT_AMBULATORY_CARE_PROVIDER_SITE_OTHER): Payer: PPO | Admitting: Family Medicine

## 2021-01-18 ENCOUNTER — Encounter: Payer: Self-pay | Admitting: Family Medicine

## 2021-01-18 ENCOUNTER — Other Ambulatory Visit: Payer: Self-pay

## 2021-01-18 VITALS — BP 138/88 | HR 87 | Temp 98.0°F | Ht <= 58 in | Wt 105.8 lb

## 2021-01-18 DIAGNOSIS — M19042 Primary osteoarthritis, left hand: Secondary | ICD-10-CM

## 2021-01-18 DIAGNOSIS — I10 Essential (primary) hypertension: Secondary | ICD-10-CM | POA: Diagnosis not present

## 2021-01-18 DIAGNOSIS — M810 Age-related osteoporosis without current pathological fracture: Secondary | ICD-10-CM

## 2021-01-18 DIAGNOSIS — M19041 Primary osteoarthritis, right hand: Secondary | ICD-10-CM | POA: Diagnosis not present

## 2021-01-18 MED ORDER — AMLODIPINE BESYLATE 10 MG PO TABS: 10.0000 mg | ORAL_TABLET | Freq: Every day | ORAL | 3 refills | Status: DC

## 2021-01-18 NOTE — Progress Notes (Signed)
Subjective  CC:  Chief Complaint  Patient presents with  . Hypertension  . Osteoporosis    Left radius -3.20     HPI: Andrea Mays is a 70 y.o. female who presents to the office today to address the problems listed above in the chief complaint.  Hypertension f/u: started low dose amlodipine 5 at last visit for new onset HTN> she has done well. Home readings are improved. avg 130s/80s. Control is improved. Pt reports she is doing well. taking medications as instructed, no medication side effects noted, no TIAs, no chest pain on exertion, no dyspnea on exertion, no swelling of ankles. She denies adverse effects from his BP medications. Compliance with medication is good.   Hand OA: mild pain, improves in the spring summer. No red hot or swollen joints.  Voltaren gel intermittently is useful  Osteoporosis, wrist: reviewed DEXA scan from 11/2020. New osteoporosis with lowest T = -3.2 at left distal radius. Other sites lowest reading -1.9/ stable osteopenia. Takes calcium and vit D. Little weight bearing exercise.   Assessment  1. Essential hypertension   2. Osteoporosis of forearm   3. Primary osteoarthritis of both hands      Plan    Hypertension f/u: BP control is improved: will increase amlodipine to 10 daily which should get her to good control. Education given. Continue home monitoring  OA hands: Tylenol as needed.  Voltaren gel as needed  Osteoporosis of distal forearm: Educated.  Osteopenia in other areas.  Continue calcium and vitamin D supplements.  Defer osteoporosis prescription medications at this time.  Recheck in 2 years.  Will initiate medications if worsening.  Patient stands and agrees with care plan.   Education regarding management of these chronic disease states was given. Management strategies discussed on successive visits include dietary and exercise recommendations, goals of achieving and maintaining IBW, and lifestyle modifications aiming for adequate sleep  and minimizing stressors.   Follow up: No follow-ups on file.  No orders of the defined types were placed in this encounter.  Meds ordered this encounter  Medications  . amLODipine (NORVASC) 10 MG tablet    Sig: Take 1 tablet (10 mg total) by mouth daily.    Dispense:  90 tablet    Refill:  3      BP Readings from Last 3 Encounters:  01/18/21 138/88  10/15/20 (!) 150/108  08/31/20 (!) 158/98   Wt Readings from Last 3 Encounters:  01/18/21 105 lb 12.8 oz (48 kg)  10/15/20 106 lb (48.1 kg)  08/31/20 105 lb 12.8 oz (48 kg)    Lab Results  Component Value Date   CHOL 147 08/31/2020   CHOL 136 07/01/2019   CHOL 203 (H) 12/18/2018   Lab Results  Component Value Date   HDL 74 08/31/2020   HDL 59 07/01/2019   HDL 62 12/18/2018   Lab Results  Component Value Date   LDLCALC 53 08/31/2020   LDLCALC 59 07/01/2019   LDLCALC 100 (H) 12/18/2018   Lab Results  Component Value Date   TRIG 115 08/31/2020   TRIG 99 07/01/2019   TRIG 205 (H) 12/18/2018   Lab Results  Component Value Date   CHOLHDL 2.0 08/31/2020   CHOLHDL 2.3 07/01/2019   CHOLHDL 3.3 12/18/2018   No results found for: LDLDIRECT Lab Results  Component Value Date   CREATININE 0.64 08/31/2020   BUN 13 08/31/2020   NA 136 08/31/2020   K 4.4 08/31/2020   CL 99 08/31/2020  CO2 27 08/31/2020    The 10-year ASCVD risk score Mikey Bussing DC Jr., et al., 2013) is: 14.3%   Values used to calculate the score:     Age: 16 years     Sex: Female     Is Non-Hispanic African American: No     Diabetic: No     Tobacco smoker: No     Systolic Blood Pressure: 876 mmHg     Is BP treated: Yes     HDL Cholesterol: 74 mg/dL     Total Cholesterol: 147 mg/dL  I reviewed the patients updated PMH, FH, and SocHx.    Patient Active Problem List   Diagnosis Date Noted  . Osteoarthritis of both hands 01/23/2020  . Osteoporosis of forearm 01/23/2020  . Hx of adenomatous colonic polyps   . Osteoarthritis of left hip   .  Mixed hyperlipidemia 07/24/2013    Allergies: Patient has no known allergies.  Social History: Patient  reports that she has never smoked. She has never used smokeless tobacco. She reports that she does not drink alcohol and does not use drugs.  Current Meds  Medication Sig  . amLODipine (NORVASC) 10 MG tablet Take 1 tablet (10 mg total) by mouth daily.  Marland Kitchen atorvastatin (LIPITOR) 40 MG tablet Take 1 tablet by mouth once daily  . Calcium Carbonate-Vitamin D (CALTRATE 600+D PO) Take 1 tablet by mouth 2 (two) times daily.   . diclofenac Sodium (VOLTAREN) 1 % GEL Apply 2 g topically 4 (four) times daily. 2g for hands and 4g for knees  . Multiple Vitamins-Minerals (CENTRUM SILVER PO) Take 1 tablet by mouth daily.  . Omega-3 Fatty Acids (FISH OIL PO) Take 1 capsule by mouth daily.  Vladimir Faster Glycol-Propyl Glycol (SYSTANE OP) Apply 1 drop to eye daily as needed (dry eyes).  . [DISCONTINUED] amLODipine (NORVASC) 5 MG tablet Take 1 tablet (5 mg total) by mouth at bedtime.    Review of Systems: Cardiovascular: negative for chest pain, palpitations, leg swelling, orthopnea Respiratory: negative for SOB, wheezing or persistent cough Gastrointestinal: negative for abdominal pain Genitourinary: negative for dysuria or gross hematuria  Objective  Vitals: BP 138/88   Pulse 87   Temp 98 F (36.7 C) (Temporal)   Ht 4\' 6"  (1.372 m)   Wt 105 lb 12.8 oz (48 kg)   SpO2 97%   BMI 25.51 kg/m  General: no acute distress  Psych:  Alert and oriented, normal mood and affect HEENT:  Normocephalic, atraumatic, supple neck  Cardiovascular:  RRR without murmur. no edema Respiratory:  Good breath sounds bilaterally, CTAB with normal respiratory effort Bilateral hands: Osteoarthritic changes without inflammatory signs  Commons side effects, risks, benefits, and alternatives for medications and treatment plan prescribed today were discussed, and the patient expressed understanding of the given instructions.  Patient is instructed to call or message via MyChart if he/she has any questions or concerns regarding our treatment plan. No barriers to understanding were identified. We discussed Red Flag symptoms and signs in detail. Patient expressed understanding regarding what to do in case of urgent or emergency type symptoms.   Medication list was reconciled, printed and provided to the patient in AVS. Patient instructions and summary information was reviewed with the patient as documented in the AVS. This note was prepared with assistance of Dragon voice recognition software. Occasional wrong-word or sound-a-like substitutions may have occurred due to the inherent limitations of voice recognition software  This visit occurred during the SARS-CoV-2 public health emergency.  Safety  protocols were in place, including screening questions prior to the visit, additional usage of staff PPE, and extensive cleaning of exam room while observing appropriate contact time as indicated for disinfecting solutions.  

## 2021-01-18 NOTE — Patient Instructions (Signed)
Please return in November for your annual complete physical; please come fasting.   If you have any questions or concerns, please don't hesitate to send me a message via MyChart or call the office at [phone]. Thank you for visiting with [place] today! It's our pleasure caring for you.   Osteoporosis  Osteoporosis happens when the bones become thin and less dense than normal. Osteoporosis makes bones more brittle and fragile and more likely to break (fracture). Over time, osteoporosis can cause your bones to become so weak that they fracture after a minor fall. Bones in the hip, wrist, and spine are most likely to fracture due to osteoporosis. What are the causes? The exact cause of this condition is not known. What increases the risk? You are more likely to develop this condition if you:  Have family members with this condition.  Have poor nutrition.  Use the following: ? Steroid medicines, such as prednisone. ? Anti-seizure medicines. ? Nicotine or tobacco, such as cigarettes, e-cigarettes, and chewing tobacco.  Are female.  Are age 52 or older.  Are not physically active (are sedentary).  Are of European or Asian descent.  Have a small body frame. What are the signs or symptoms? A fracture might be the first sign of osteoporosis, especially if the fracture results from a fall or injury that usually would not cause a bone to break. Other signs and symptoms include:  Pain in the neck or low back.  Stooped posture.  Loss of height. How is this diagnosed? This condition may be diagnosed based on:  Your medical history.  A physical exam.  A bone mineral density test, also called a DXA or DEXA test (dual-energy X-ray absorptiometry test). This test uses X-rays to measure the amount of minerals in your bones. How is this treated? This condition may be treated by:  Making lifestyle changes, such as: ? Including foods with more calcium and vitamin D in your  diet. ? Doing weight-bearing and muscle-strengthening exercises. ? Stopping tobacco use. ? Limiting alcohol intake.  Taking medicine to slow the process of bone loss or to increase bone density.  Taking daily supplements of calcium and vitamin D.  Taking hormone replacement medicines, such as estrogen for women and testosterone for men.  Monitoring your levels of calcium and vitamin D. The goal of treatment is to strengthen your bones and lower your risk for a fracture. Follow these instructions at home: Eating and drinking Include calcium and vitamin D in your diet. Calcium is important for bone health, and vitamin D helps your body absorb calcium. Good sources of calcium and vitamin D include:  Certain fatty fish, such as salmon and tuna.  Products that have calcium and vitamin D added to them (are fortified), such as fortified cereals.  Egg yolks.  Cheese.  Liver.   Activity Do exercises as told by your health care provider. Ask your health care provider what exercises and activities are safe for you. You should do:  Exercises that make you work against gravity (weight-bearing exercises), such as tai chi, yoga, or walking.  Exercises to strengthen muscles, such as lifting weights. Lifestyle  Do not drink alcohol if: ? Your health care provider tells you not to drink. ? You are pregnant, may be pregnant, or are planning to become pregnant.  If you drink alcohol: ? Limit how much you use to:  0-1 drink a day for women.  0-2 drinks a day for men.  Know how much alcohol is in  your drink. In the [place] one drink equals one 12 oz bottle of beer (355 mL), one 5 oz glass of wine (148 mL), or one 1 oz glass of hard liquor (44 mL).  Do not use any products that contain nicotine or tobacco, such as cigarettes, e-cigarettes, and chewing tobacco. If you need help quitting, ask your health care provider. Preventing falls  Use devices to help you move around (mobility aids) as  needed, such as canes, walkers, scooters, or crutches.  Keep rooms well-lit and clutter-free.  Remove tripping hazards from walkways, including cords and throw rugs.  Install grab bars in bathrooms and safety rails on stairs.  Use rubber mats in the bathroom and other areas that are often wet or slippery.  Wear closed-toe shoes that fit well and support your feet. Wear shoes that have rubber soles or low heels.  Review your medicines with your health care provider. Some medicines can cause dizziness or changes in blood pressure, which can increase your risk of falling. General instructions  Take over-the-counter and prescription medicines only as told by your health care provider.  Keep all follow-up visits. This is important. Contact a health care provider if:  You have never been screened for osteoporosis and you are: ? A woman who is age 52 or older. ? A man who is age 77 or older. Get help right away if:  You fall or injure yourself. Summary  Osteoporosis is thinning and loss of density in your bones. This makes bones more brittle and fragile and more likely to break (fracture),even with minor falls.  The goal of treatment is to strengthen your bones and lower your risk for a fracture.  Include calcium and vitamin D in your diet. Calcium is important for bone health, and vitamin D helps your body absorb calcium.  Talk with your health care provider about screening for osteoporosis if you are a woman who is age 25 or older, or a man who is age 71 or older. This information is not intended to replace advice given to you by your health care provider. Make sure you discuss any questions you have with your health care provider. Document Revised: 03/11/2020 Document Reviewed: 03/11/2020 Elsevier Patient Education  [place].

## 2021-01-20 ENCOUNTER — Other Ambulatory Visit: Payer: Self-pay | Admitting: Family Medicine

## 2021-02-16 ENCOUNTER — Other Ambulatory Visit: Payer: Self-pay

## 2021-02-16 ENCOUNTER — Encounter: Payer: Self-pay | Admitting: Family Medicine

## 2021-02-16 ENCOUNTER — Ambulatory Visit (INDEPENDENT_AMBULATORY_CARE_PROVIDER_SITE_OTHER): Payer: PPO | Admitting: Family Medicine

## 2021-02-16 VITALS — BP 124/80 | HR 90 | Temp 98.0°F | Ht <= 58 in | Wt 107.4 lb

## 2021-02-16 DIAGNOSIS — I10 Essential (primary) hypertension: Secondary | ICD-10-CM

## 2021-02-16 DIAGNOSIS — T50905A Adverse effect of unspecified drugs, medicaments and biological substances, initial encounter: Secondary | ICD-10-CM | POA: Diagnosis not present

## 2021-02-16 DIAGNOSIS — R600 Localized edema: Secondary | ICD-10-CM | POA: Diagnosis not present

## 2021-02-16 MED ORDER — AMLODIPINE BESYLATE 5 MG PO TABS: 5.0000 mg | ORAL_TABLET | Freq: Every day | ORAL | 3 refills | Status: DC

## 2021-02-16 MED ORDER — HYDROCHLOROTHIAZIDE 12.5 MG PO CAPS: 12.5000 mg | ORAL_CAPSULE | Freq: Every day | ORAL | 3 refills | Status: DC

## 2021-02-16 NOTE — Patient Instructions (Signed)
Please return in November for your annual complete physical and blood pressure recheck; please come fasting.  I have decreased the amlodipine back to 5mg  daily and added hydrochlorothiazide 12.5mg  daily. You may take both pills together in the mornings.   If you have any questions or concerns, please don't hesitate to send me a message via MyChart or call the office at [phone]. Thank you for visiting with Andrea Mays today! It's our pleasure caring for you.

## 2021-02-16 NOTE — Progress Notes (Signed)
Subjective  CC:  Chief Complaint  Patient presents with  . Leg Swelling    Both legs, on going for about 1 week    HPI: Andrea Mays is a 70 y.o. female who presents to the office today to address the problems listed above in the chief complaint.  Hypertension f/u: Control is good . Pt reports she is doing well. However, now with ankle swelling, worse in the afternoons or after prolonged sitting. taking medications as instructed, no TIAs, no chest pain on exertion, no dyspnea on exertion. Home bps are great: 110-120s/70s. She denies adverse effects from his BP medications. Compliance with medication is good. We increased amlodipine from 5 to 10 2 months ago.  Assessment  1. Bilateral leg edema   2. Essential hypertension   3. Adverse effect of drug, initial encounter      Plan    Hypertension f/u: BP control is well controlled. However, + edema from amlodipine. Decrease dose back to 5 and add microzide 12.5 daily. Education given.   Education regarding management of these chronic disease states was given. Management strategies discussed on successive visits include dietary and exercise recommendations, goals of achieving and maintaining IBW, and lifestyle modifications aiming for adequate sleep and minimizing stressors.   Follow up: No follow-ups on file.  No orders of the defined types were placed in this encounter.  Meds ordered this encounter  Medications  . amLODipine (NORVASC) 5 MG tablet    Sig: Take 1 tablet (5 mg total) by mouth daily.    Dispense:  90 tablet    Refill:  3    Stop the 10mg  dose.  . hydrochlorothiazide (MICROZIDE) 12.5 MG capsule    Sig: Take 1 capsule (12.5 mg total) by mouth daily.    Dispense:  90 capsule    Refill:  3      BP Readings from Last 3 Encounters:  02/16/21 124/80  01/18/21 138/88  10/15/20 (!) 150/108   Wt Readings from Last 3 Encounters:  02/16/21 107 lb 6.4 oz (48.7 kg)  01/18/21 105 lb 12.8 oz (48 kg)  10/15/20 106  lb (48.1 kg)    Lab Results  Component Value Date   CHOL 147 08/31/2020   CHOL 136 07/01/2019   CHOL 203 (H) 12/18/2018   Lab Results  Component Value Date   HDL 74 08/31/2020   HDL 59 07/01/2019   HDL 62 12/18/2018   Lab Results  Component Value Date   LDLCALC 53 08/31/2020   LDLCALC 59 07/01/2019   LDLCALC 100 (H) 12/18/2018   Lab Results  Component Value Date   TRIG 115 08/31/2020   TRIG 99 07/01/2019   TRIG 205 (H) 12/18/2018   Lab Results  Component Value Date   CHOLHDL 2.0 08/31/2020   CHOLHDL 2.3 07/01/2019   CHOLHDL 3.3 12/18/2018   No results found for: LDLDIRECT Lab Results  Component Value Date   CREATININE 0.64 08/31/2020   BUN 13 08/31/2020   NA 136 08/31/2020   K 4.4 08/31/2020   CL 99 08/31/2020   CO2 27 08/31/2020    The 10-year ASCVD risk score Mikey Bussing DC Jr., et al., 2013) is: 11.7%   Values used to calculate the score:     Age: 54 years     Sex: Female     Is Non-Hispanic African American: No     Diabetic: No     Tobacco smoker: No     Systolic Blood Pressure: 034 mmHg  Is BP treated: Yes     HDL Cholesterol: 74 mg/dL     Total Cholesterol: 147 mg/dL  I reviewed the patients updated PMH, FH, and SocHx.    Patient Active Problem List   Diagnosis Date Noted  . Essential hypertension 02/16/2021  . Osteoarthritis of both hands 01/23/2020  . Osteoporosis of forearm 01/23/2020  . Hx of adenomatous colonic polyps   . Osteoarthritis of left hip   . Mixed hyperlipidemia 07/24/2013    Allergies: Patient has no known allergies.  Social History: Patient  reports that she has never smoked. She has never used smokeless tobacco. She reports that she does not drink alcohol and does not use drugs.  Current Meds  Medication Sig  . atorvastatin (LIPITOR) 40 MG tablet Take 1 tablet by mouth once daily  . Calcium Carbonate-Vitamin D (CALTRATE 600+D PO) Take 1 tablet by mouth 2 (two) times daily.   . diclofenac Sodium (VOLTAREN) 1 % GEL Apply  2 g topically 4 (four) times daily. 2g for hands and 4g for knees  . Multiple Vitamins-Minerals (CENTRUM SILVER PO) Take 1 tablet by mouth daily.  . Omega-3 Fatty Acids (FISH OIL PO) Take 1 capsule by mouth daily.  Vladimir Faster Glycol-Propyl Glycol (SYSTANE OP) Apply 1 drop to eye daily as needed (dry eyes).  . [DISCONTINUED] amLODipine (NORVASC) 10 MG tablet Take 1 tablet (10 mg total) by mouth daily.  . hydrochlorothiazide (MICROZIDE) 12.5 MG capsule Take 1 capsule (12.5 mg total) by mouth daily.    Review of Systems: Cardiovascular: negative for chest pain, palpitations, leg swelling, orthopnea Respiratory: negative for SOB, wheezing or persistent cough Gastrointestinal: negative for abdominal pain Genitourinary: negative for dysuria or gross hematuria  Objective  Vitals: BP 124/80   Pulse 90   Temp 98 F (36.7 C) (Temporal)   Ht 4\' 6"  (1.372 m)   Wt 107 lb 6.4 oz (48.7 kg)   SpO2 97%   BMI 25.90 kg/m  General: no acute distress  Cardiovascular:  RRR without murmur. no edema Respiratory:  Good breath sounds bilaterally, CTAB with normal respiratory effort Ankles with +1 non pitting edema. Nl pedal pulses.    Commons side effects, risks, benefits, and alternatives for medications and treatment plan prescribed today were discussed, and the patient expressed understanding of the given instructions. Patient is instructed to call or message via MyChart if he/she has any questions or concerns regarding our treatment plan. No barriers to understanding were identified. We discussed Red Flag symptoms and signs in detail. Patient expressed understanding regarding what to do in case of urgent or emergency type symptoms.   Medication list was reconciled, printed and provided to the patient in AVS. Patient instructions and summary information was reviewed with the patient as documented in the AVS. This note was prepared with assistance of Dragon voice recognition software. Occasional wrong-word  or sound-a-like substitutions may have occurred due to the inherent limitations of voice recognition software  This visit occurred during the SARS-CoV-2 public health emergency.  Safety protocols were in place, including screening questions prior to the visit, additional usage of staff PPE, and extensive cleaning of exam room while observing appropriate contact time as indicated for disinfecting solutions.

## 2021-04-19 ENCOUNTER — Other Ambulatory Visit: Payer: Self-pay | Admitting: Family Medicine

## 2021-07-18 ENCOUNTER — Other Ambulatory Visit: Payer: Self-pay | Admitting: Family Medicine

## 2021-08-05 ENCOUNTER — Other Ambulatory Visit: Payer: Self-pay | Admitting: Family Medicine

## 2021-08-10 ENCOUNTER — Other Ambulatory Visit: Payer: Self-pay | Admitting: Family Medicine

## 2021-08-10 DIAGNOSIS — Z1231 Encounter for screening mammogram for malignant neoplasm of breast: Secondary | ICD-10-CM

## 2021-08-23 ENCOUNTER — Encounter: Payer: Self-pay | Admitting: Family Medicine

## 2021-08-23 ENCOUNTER — Ambulatory Visit (INDEPENDENT_AMBULATORY_CARE_PROVIDER_SITE_OTHER): Payer: PPO | Admitting: Family Medicine

## 2021-08-23 ENCOUNTER — Other Ambulatory Visit: Payer: Self-pay

## 2021-08-23 VITALS — BP 140/100 | HR 88 | Temp 97.9°F | Ht <= 58 in | Wt 109.2 lb

## 2021-08-23 DIAGNOSIS — Z23 Encounter for immunization: Secondary | ICD-10-CM

## 2021-08-23 DIAGNOSIS — M810 Age-related osteoporosis without current pathological fracture: Secondary | ICD-10-CM

## 2021-08-23 DIAGNOSIS — M25532 Pain in left wrist: Secondary | ICD-10-CM | POA: Diagnosis not present

## 2021-08-23 DIAGNOSIS — E782 Mixed hyperlipidemia: Secondary | ICD-10-CM | POA: Diagnosis not present

## 2021-08-23 DIAGNOSIS — I10 Essential (primary) hypertension: Secondary | ICD-10-CM | POA: Diagnosis not present

## 2021-08-23 DIAGNOSIS — Z86010 Personal history of colonic polyps: Secondary | ICD-10-CM | POA: Diagnosis not present

## 2021-08-23 DIAGNOSIS — M19042 Primary osteoarthritis, left hand: Secondary | ICD-10-CM | POA: Diagnosis not present

## 2021-08-23 DIAGNOSIS — Z0000 Encounter for general adult medical examination without abnormal findings: Secondary | ICD-10-CM

## 2021-08-23 DIAGNOSIS — M1711 Unilateral primary osteoarthritis, right knee: Secondary | ICD-10-CM | POA: Diagnosis not present

## 2021-08-23 DIAGNOSIS — M19041 Primary osteoarthritis, right hand: Secondary | ICD-10-CM

## 2021-08-23 DIAGNOSIS — M1612 Unilateral primary osteoarthritis, left hip: Secondary | ICD-10-CM | POA: Diagnosis not present

## 2021-08-23 LAB — CBC WITH DIFFERENTIAL/PLATELET
Basophils Absolute: 0 10*3/uL (ref 0.0–0.1)
Basophils Relative: 0.7 % (ref 0.0–3.0)
Eosinophils Absolute: 0.1 10*3/uL (ref 0.0–0.7)
Eosinophils Relative: 1.8 % (ref 0.0–5.0)
HCT: 43.6 % (ref 36.0–46.0)
Hemoglobin: 14.4 g/dL (ref 12.0–15.0)
Lymphocytes Relative: 19.6 % (ref 12.0–46.0)
Lymphs Abs: 1.2 10*3/uL (ref 0.7–4.0)
MCHC: 32.9 g/dL (ref 30.0–36.0)
MCV: 87.1 fl (ref 78.0–100.0)
Monocytes Absolute: 0.7 10*3/uL (ref 0.1–1.0)
Monocytes Relative: 10.6 % (ref 3.0–12.0)
Neutro Abs: 4.1 10*3/uL (ref 1.4–7.7)
Neutrophils Relative %: 67.3 % (ref 43.0–77.0)
Platelets: 304 10*3/uL (ref 150.0–400.0)
RBC: 5 Mil/uL (ref 3.87–5.11)
RDW: 13.7 % (ref 11.5–15.5)
WBC: 6.1 10*3/uL (ref 4.0–10.5)

## 2021-08-23 LAB — COMPREHENSIVE METABOLIC PANEL
ALT: 22 U/L (ref 0–35)
AST: 22 U/L (ref 0–37)
Albumin: 4.4 g/dL (ref 3.5–5.2)
Alkaline Phosphatase: 95 U/L (ref 39–117)
BUN: 14 mg/dL (ref 6–23)
CO2: 27 mEq/L (ref 19–32)
Calcium: 9.5 mg/dL (ref 8.4–10.5)
Chloride: 101 mEq/L (ref 96–112)
Creatinine, Ser: 0.66 mg/dL (ref 0.40–1.20)
GFR: 87.87 mL/min (ref >60.00)
Glucose, Bld: 90 mg/dL (ref 70–99)
Potassium: 4.4 mEq/L (ref 3.5–5.1)
Sodium: 135 mEq/L (ref 135–145)
Total Bilirubin: 0.6 mg/dL (ref 0.2–1.2)
Total Protein: 7.5 g/dL (ref 6.0–8.3)

## 2021-08-23 LAB — TSH: TSH: 1.89 u[IU]/mL (ref 0.35–5.50)

## 2021-08-23 LAB — LIPID PANEL
Cholesterol: 136 mg/dL (ref 0–200)
HDL: 66.7 mg/dL (ref >39.00)
LDL Cholesterol: 45 mg/dL (ref 0–99)
NonHDL: 69.32
Total CHOL/HDL Ratio: 2
Triglycerides: 124 mg/dL (ref 0.0–149.0)
VLDL: 24.8 mg/dL (ref 0.0–40.0)

## 2021-08-23 MED ORDER — HYDROCHLOROTHIAZIDE 12.5 MG PO CAPS: 12.5000 mg | ORAL_CAPSULE | Freq: Every day | ORAL | 3 refills | Status: DC

## 2021-08-23 NOTE — Patient Instructions (Signed)
Please return in 6 months for hypertension follow up. Sooner if your knee pain or wrist pain does not resolve.   I will release your lab results to you on your MyChart account with further instructions. Please reply with any questions.    Use advil twice a day for the next week; ice your knee and wrist. Wear a wrist support for 2 weeks.   If you have any questions or concerns, please don't hesitate to send me a message via MyChart or call the office at [phone]. Thank you for visiting with [place] today! It's our pleasure caring for you.  Preventive Care 68 Years and Older, Female Preventive care refers to lifestyle choices and visits with your health care provider that can promote health and wellness. Preventive care visits are also called wellness exams. What can I expect for my preventive care visit? Counseling Your health care provider may ask you questions about your: Medical history, including: Past medical problems. Family medical history. Pregnancy and menstrual history. History of falls. Current health, including: Memory and ability to understand (cognition). Emotional well-being. Home life and relationship well-being. Sexual activity and sexual health. Lifestyle, including: Alcohol, nicotine or tobacco, and drug use. Access to firearms. Diet, exercise, and sleep habits. Work and work Statistician. Sunscreen use. Safety issues such as seatbelt and bike helmet use. Physical exam Your health care provider will check your: Height and weight. These may be used to calculate your BMI (body mass index). BMI is a measurement that tells if you are at a healthy weight. Waist circumference. This measures the distance around your waistline. This measurement also tells if you are at a healthy weight and may help predict your risk of certain diseases, such as type 2 diabetes and high blood pressure. Heart rate and blood pressure. Body temperature. Skin for abnormal spots. What  immunizations do I need? Vaccines are usually given at various ages, according to a schedule. Your health care provider will recommend vaccines for you based on your age, medical history, and lifestyle or other factors, such as travel or where you work. What tests do I need? Screening Your health care provider may recommend screening tests for certain conditions. This may include: Lipid and cholesterol levels. Hepatitis C test. Hepatitis B test. HIV (human immunodeficiency virus) test. STI (sexually transmitted infection) testing, if you are at risk. Lung cancer screening. Colorectal cancer screening. Diabetes screening. This is done by checking your blood sugar (glucose) after you have not eaten for a while (fasting). Mammogram. Talk with your health care provider about how often you should have regular mammograms. BRCA-related cancer screening. This may be done if you have a family history of breast, ovarian, tubal, or peritoneal cancers. Bone density scan. This is done to screen for osteoporosis. Talk with your health care provider about your test results, treatment options, and if necessary, the need for more tests. Follow these instructions at home: Eating and drinking  Eat a diet that includes fresh fruits and vegetables, whole grains, lean protein, and low-fat dairy products. Limit your intake of foods with high amounts of sugar, saturated fats, and salt. Take vitamin and mineral supplements as recommended by your health care provider. Do not drink alcohol if your health care provider tells you not to drink. If you drink alcohol: Limit how much you have to 0-1 drink a day. Know how much alcohol is in your drink. In the [place] one drink equals one 12 oz bottle of beer (355 mL), one 5 oz glass of  wine (148 mL), or one 1 oz glass of hard liquor (44 mL). Lifestyle Brush your teeth every morning and night with fluoride toothpaste. Floss one time each day. Exercise for at least 30  minutes 5 or more days each week. Do not use any products that contain nicotine or tobacco. These products include cigarettes, chewing tobacco, and vaping devices, such as e-cigarettes. If you need help quitting, ask your health care provider. Do not use drugs. If you are sexually active, practice safe sex. Use a condom or other form of protection in order to prevent STIs. Take aspirin only as told by your health care provider. Make sure that you understand how much to take and what form to take. Work with your health care provider to find out whether it is safe and beneficial for you to take aspirin daily. Ask your health care provider if you need to take a cholesterol-lowering medicine (statin). Find healthy ways to manage stress, such as: Meditation, yoga, or listening to music. Journaling. Talking to a trusted person. Spending time with friends and family. Minimize exposure to UV radiation to reduce your risk of skin cancer. Safety Always wear your seat belt while driving or riding in a vehicle. Do not drive: If you have been drinking alcohol. Do not ride with someone who has been drinking. When you are tired or distracted. While texting. If you have been using any mind-altering substances or drugs. Wear a helmet and other protective equipment during sports activities. If you have firearms in your house, make sure you follow all gun safety procedures. What's next? Visit your health care provider once a year for an annual wellness visit. Ask your health care provider how often you should have your eyes and teeth checked. Stay up to date on all vaccines. This information is not intended to replace advice given to you by your health care provider. Make sure you discuss any questions you have with your health care provider. Document Revised: 03/23/2021 Document Reviewed: 03/23/2021 Elsevier Patient Education  [place].

## 2021-08-23 NOTE — Progress Notes (Signed)
Subjective  Chief Complaint  Patient presents with   Annual Exam    Fasting   Hypertension   Hyperlipidemia    HPI: Andrea Mays is a 70 y.o. female who presents to [facility] [facility] [place] today for a Female Wellness Visit. She also has the concerns and/or needs as listed above in the chief complaint. These will be addressed in addition to the Health Maintenance Visit.   Wellness Visit: annual visit with health maintenance review and exam without Pap  HM: Doing well overall.  Eligible for flu shot today and COVID booster Health Maintenance  Topic Date Due   TETANUS/TDAP  12/06/2021 (Originally 08/10/1970)   COVID-19 Vaccine (5 - Booster for [place] series) 12/06/2021 (Originally 04/08/2021)   MAMMOGRAM  09/07/2021   COLONOSCOPY (Pts 45-34yrs Insurance coverage will need to be confirmed)  09/07/2022   DEXA SCAN  11/17/2022   Pneumonia Vaccine 26+ Years old  Completed   INFLUENZA VACCINE  Completed   Hepatitis C Screening  Completed   Zoster Vaccines- Shingrix  Completed   HPV VACCINES  Aged Out    Chronic disease f/u and/or acute problem visit: (deemed necessary to be done in addition to the wellness visit): Hypertension: Patient reports that home readings are always normal.  She checks in the morning and evening.  Average 1 10-1 20 over 70s.  No symptoms of low blood pressure.  No palpitations or chest pain.  She is very compliant with amlodipine 5 mg daily and HCTZ 12.5 mg daily.  Blood pressure is elevated here in the office today..  Very atypical for her Hyperlipidemia on Lipitor 40.  Tolerates well.  Fasting for recheck today. Osteoporosis by bone density and forearm only.  We had a discussion about starting treatment.  Patient elects to defer.  She walks for exercise. Has known osteoarthritis in hands and hip.  But today complains of right knee pain for the last several weeks and 2 days of left wrist pain.  Awakens and feels well but unable start hurting after  she is up and about more.  Some swelling.  No injury.  No locking or give way.  Left wrist has hurt over the last 2 days.  She works a Mining engineer and cooking.  No injury.  No radicular symptoms, neck pain or weakness.  Assessment  1. Annual physical exam   2. Need for immunization against influenza   3. Essential hypertension   4. Primary osteoarthritis of both hands   5. Osteoporosis of forearm   6. Mixed hyperlipidemia   7. Primary osteoarthritis of left hip   8. Hx of adenomatous colonic polyps   9. Primary osteoarthritis of right knee   10. Left wrist pain      Plan  Female Wellness Visit: Age appropriate Health Maintenance and Prevention measures were discussed with patient. Included topics are cancer screening recommendations, ways to keep healthy (see AVS) including dietary and exercise recommendations, regular eye and dental care, use of seat belts, and avoidance of moderate alcohol use and tobacco use.  Screens are current.  Will be due for colonoscopy and bone density next year BMI: discussed patient's BMI and encouraged positive lifestyle modifications to help get to or maintain a target BMI. HM needs and immunizations were addressed and ordered. See below for orders. See HM and immunization section for updates.  Flu shot updated today.  Patient to get COVID booster at her convenience Routine labs and screening tests ordered including cmp, cbc  and lipids where appropriate. Discussed recommendations regarding Vit D and calcium supplementation (see AVS)  Chronic disease management visit and/or acute problem visit: Hypertension: Well-controlled by home readings.  Atypical reading here in the office today.  Continue amlodipine 5 mg daily and HCTZ 12.5.  Check electrolytes and renal function today. Hyperlipidemia: Due for fasting recheck.  Continue statin.  Lipitor 40 mg nightly Osteoarthritis: Likely knee osteoarthritis flare: Ice, Advil twice daily x1 to 2 weeks.  Follow-up in  office if not improving.  Wrist pain could be arthritis or tendinitis.  Wrist splint ice and Advil also recommended. Osteoporosis: Recheck bone density next year and if worsening, will start biphosphonate.  Continue calcium, vitamin D and weightbearing exercise History of colonic polyp: Due colonoscopy next year.  Follow up: Return in about 6 months (around 02/20/2022) for follow up Hypertension.  Orders Placed This Encounter  Procedures   CBC with Differential/Platelet   Comprehensive metabolic panel   Lipid panel   TSH    Meds ordered this encounter  Medications   hydrochlorothiazide (MICROZIDE) 12.5 MG capsule    Sig: Take 1 capsule (12.5 mg total) by mouth daily.    Dispense:  90 capsule    Refill:  3       Body mass index is 26.33 kg/m. Wt Readings from Last 3 Encounters:  08/23/21 109 lb 3.2 oz (49.5 kg)  02/16/21 107 lb 6.4 oz (48.7 kg)  01/18/21 105 lb 12.8 oz (48 kg)     Patient Active Problem List   Diagnosis Date Noted   Primary osteoarthritis of right knee 08/23/2021   Essential hypertension 02/16/2021    Amlodipine started 2022. Had leg swelling with 10mg  so down to 5mg . Added hctz.    Osteoarthritis of both hands 01/23/2020   Osteoporosis of forearm 01/23/2020    dexa 2018; osteopenia DEXA 11/2020: T + -3.2 left 1/3 radius. Other areas lowest -1.9;      Hx of adenomatous colonic polyps    Osteoarthritis of left hip    Mixed hyperlipidemia 07/24/2013    HDL very good offsets LDL    Health Maintenance  Topic Date Due   INFLUENZA VACCINE  05/09/2021   TETANUS/TDAP  12/06/2021 (Originally 08/10/1970)   COVID-19 Vaccine (5 - Booster for [place] series) 12/06/2021 (Originally 04/08/2021)   MAMMOGRAM  09/07/2021   COLONOSCOPY (Pts 45-78yrs Insurance coverage will need to be confirmed)  09/07/2022   DEXA SCAN  11/17/2022   Pneumonia Vaccine 63+ Years old  Completed   Hepatitis C Screening  Completed   Zoster Vaccines- Shingrix  Completed   HPV VACCINES   Aged Out   Immunization History  Administered Date(s) Administered   Fluad Quad(high Dose 65+) 07/26/2020   Influenza Split 07/24/2013   Influenza, High Dose Seasonal PF 07/08/2019   Influenza,inj,Quad PF,6+ Mos 07/29/2014, 08/09/2015, 08/11/2016, 06/15/2017, 06/26/2018   PFIZER(Purple Top)SARS-COV-2 Vaccination 10/31/2019, 11/21/2019, 06/18/2020, 02/11/2021   Pneumococcal Conjugate-13 08/09/2015   Pneumococcal Polysaccharide-23 12/07/2016   Zoster Recombinat (Shingrix) 07/23/2018, 09/25/2018   Zoster, Live 05/09/2014   We updated and reviewed the patient's past history in detail and it is documented below. Allergies: Patient has No Known Allergies. Past Medical History Patient  has a past medical history of No abnormality seen and Osteopenia (01/23/2020). Past Surgical History Patient  has a past surgical history that includes Total hip arthroplasty (Left, 11/25/2014); Colonoscopy (N/A, 09/07/2017); and polypectomy (09/07/2017). Family History: Patient family history is not on file. Social History:  Patient  reports that she has never  smoked. She has never used smokeless tobacco. She reports that she does not drink alcohol and does not use drugs.  Review of Systems: Constitutional: negative for fever or malaise Ophthalmic: negative for photophobia, double vision or loss of vision Cardiovascular: negative for chest pain, dyspnea on exertion, or new LE swelling Respiratory: negative for SOB or persistent cough Gastrointestinal: negative for abdominal pain, change in bowel habits or melena Genitourinary: negative for dysuria or gross hematuria, no abnormal uterine bleeding or disharge Musculoskeletal: negative for new gait disturbance or muscular weakness Integumentary: negative for new or persistent rashes, no breast lumps Neurological: negative for TIA or stroke symptoms Psychiatric: negative for SI or delusions Allergic/Immunologic: negative for hives  Patient Care Team     Relationship Specialty Notifications Start End  Leamon Arnt, MD PCP - General Family Medicine  01/23/20     Objective  Vitals: BP 112/70 Comment: by consistent home readings  Pulse 88   Temp 97.9 F (36.6 C) (Temporal)   Ht 4\' 6"  (1.372 m)   Wt 109 lb 3.2 oz (49.5 kg)   SpO2 97%   BMI 26.33 kg/m  General:  Well developed, well nourished, no acute distress  Psych:  Alert and orientedx3,normal mood and affect HEENT:  Normocephalic, atraumatic, non-icteric sclera,  supple neck without adenopathy, mass or thyromegaly Cardiovascular:  Normal S1, S2, RRR without gallop, rub or murmur Respiratory:  Good breath sounds bilaterally, CTAB with normal respiratory effort Gastrointestinal: normal bowel sounds, soft, non-tender, no noted masses. No HSM MSK: OA changes in hands, right knee: small effusion, warm with crepitus. O/w normal exam. Left wrist: pain with flexion. Neg finklesteins. Joints are without erythema or swelling.  Skin:  Warm, no rashes or suspicious lesions noted Neurologic:    Mental status is normal. CN 2-11 are normal. Gross motor and sensory exams are normal. Normal gait. No tremor   Commons side effects, risks, benefits, and alternatives for medications and treatment plan prescribed today were discussed, and the patient expressed understanding of the given instructions. Patient is instructed to call or message via MyChart if he/she has any questions or concerns regarding our treatment plan. No barriers to understanding were identified. We discussed Red Flag symptoms and signs in detail. Patient expressed understanding regarding what to do in case of urgent or emergency type symptoms.  Medication list was reconciled, printed and provided to the patient in AVS. Patient instructions and summary information was reviewed with the patient as documented in the AVS. This note was prepared with assistance of Dragon voice recognition software. Occasional wrong-word or sound-a-like  substitutions may have occurred due to the inherent limitations of voice recognition software  This visit occurred during the SARS-CoV-2 public health emergency.  Safety protocols were in place, including screening questions prior to the visit, additional usage of staff PPE, and extensive cleaning of exam room while observing appropriate contact time as indicated for disinfecting solutions.

## 2021-09-12 ENCOUNTER — Ambulatory Visit
Admission: RE | Admit: 2021-09-12 | Discharge: 2021-09-12 | Disposition: A | Discharge status: Home / Self Care | Payer: PPO | Source: Ambulatory Visit | Attending: Family Medicine | Admitting: Family Medicine

## 2021-09-12 ENCOUNTER — Other Ambulatory Visit: Payer: Self-pay

## 2021-09-12 DIAGNOSIS — Z1231 Encounter for screening mammogram for malignant neoplasm of breast: Secondary | ICD-10-CM | POA: Diagnosis not present

## 2021-10-12 ENCOUNTER — Other Ambulatory Visit: Payer: Self-pay | Admitting: Family Medicine

## 2021-10-16 ENCOUNTER — Other Ambulatory Visit: Payer: Self-pay | Admitting: Family Medicine

## 2021-11-16 ENCOUNTER — Telehealth: Payer: Self-pay | Admitting: Family Medicine

## 2021-11-16 NOTE — Telephone Encounter (Signed)
Declined AWV do not call 11/16/21

## 2021-12-12 DIAGNOSIS — H5203 Hypermetropia, bilateral: Secondary | ICD-10-CM | POA: Diagnosis not present

## 2021-12-12 DIAGNOSIS — H524 Presbyopia: Secondary | ICD-10-CM | POA: Diagnosis not present

## 2021-12-12 DIAGNOSIS — H25813 Combined forms of age-related cataract, bilateral: Secondary | ICD-10-CM | POA: Diagnosis not present

## 2022-01-10 ENCOUNTER — Other Ambulatory Visit: Payer: Self-pay | Admitting: Family Medicine

## 2022-01-16 ENCOUNTER — Other Ambulatory Visit: Payer: Self-pay | Admitting: Family Medicine

## 2022-02-21 ENCOUNTER — Encounter: Payer: Self-pay | Admitting: Family Medicine

## 2022-02-21 ENCOUNTER — Ambulatory Visit (INDEPENDENT_AMBULATORY_CARE_PROVIDER_SITE_OTHER): Payer: PPO | Admitting: Family Medicine

## 2022-02-21 VITALS — BP 122/74 | HR 86 | Temp 98.3°F | Ht <= 58 in | Wt 108.2 lb

## 2022-02-21 DIAGNOSIS — I10 Essential (primary) hypertension: Secondary | ICD-10-CM | POA: Diagnosis not present

## 2022-02-21 NOTE — Patient Instructions (Signed)
Please return in 6 months for your annual complete physical; please come fasting.   If you have any questions or concerns, please don't hesitate to send me a message via MyChart or call the office at [phone]. Thank you for visiting with us today! It's our pleasure caring for you.  

## 2022-02-21 NOTE — Progress Notes (Signed)
? ?Subjective  ?CC:  ?Chief Complaint  ?Patient presents with  ? Hypertension  ?  Pt here to F/U with Bp ?  ? ? ?HPI: Andrea Mays is a 71 y.o. female who presents to the office today to address the problems listed above in the chief complaint. ?Hypertension f/u: Control is good . Pt reports she is doing well. taking medications as instructed, no medication side effects noted, no TIAs, no chest pain on exertion, no dyspnea on exertion, no swelling of ankles. She denies adverse effects from his BP medications. Compliance with medication is good.  ? ?Assessment  ?1. Essential hypertension   ? ?  ?Plan  ? ?Hypertension f/u: BP control is well controlled. Continue amlodipin 5 and hctz 12.5. ? ?Education regarding management of these chronic disease states was given. Management strategies discussed on successive visits include dietary and exercise recommendations, goals of achieving and maintaining IBW, and lifestyle modifications aiming for adequate sleep and minimizing stressors.  ? ?Follow up: 6 mo for cpe ? ?No orders of the defined types were placed in this encounter. ? ?No orders of the defined types were placed in this encounter. ? ?  ? ?BP Readings from Last 3 Encounters:  ?02/21/22 122/74  ?08/23/21 (!) 140/100  ?02/16/21 124/80  ? ?Wt Readings from Last 3 Encounters:  ?02/21/22 108 lb 3.2 oz (49.1 kg)  ?08/23/21 109 lb 3.2 oz (49.5 kg)  ?02/16/21 107 lb 6.4 oz (48.7 kg)  ? ? ?Lab Results  ?Component Value Date  ? CHOL 136 08/23/2021  ? CHOL 147 08/31/2020  ? CHOL 136 07/01/2019  ? ?Lab Results  ?Component Value Date  ? HDL 66.70 08/23/2021  ? HDL 74 08/31/2020  ? HDL 59 07/01/2019  ? ?Lab Results  ?Component Value Date  ? LDLCALC 45 08/23/2021  ? [place] 53 08/31/2020  ? [place] 59 07/01/2019  ? ?Lab Results  ?Component Value Date  ? TRIG 124.0 08/23/2021  ? TRIG 115 08/31/2020  ? TRIG 99 07/01/2019  ? ?Lab Results  ?Component Value Date  ? CHOLHDL 2 08/23/2021  ? CHOLHDL 2.0 08/31/2020  ? CHOLHDL 2.3  07/01/2019  ? ?No results found for: LDLDIRECT ?Lab Results  ?Component Value Date  ? CREATININE 0.66 08/23/2021  ? BUN 14 08/23/2021  ? NA 135 08/23/2021  ? K 4.4 08/23/2021  ? CL 101 08/23/2021  ? CO2 27 08/23/2021  ? ? ?The 10-year ASCVD risk score (Arnett DK, et al., 2019) is: 12.8% ?  Values used to calculate the score: ?    Age: 33 years ?    Sex: Female ?    Is Non-Hispanic African American: No ?    Diabetic: No ?    Tobacco smoker: No ?    Systolic Blood Pressure: 474 mmHg ?    Is BP treated: Yes ?    HDL Cholesterol: 66.7 mg/dL ?    Total Cholesterol: 136 mg/dL ? ?I reviewed the patients updated PMH, FH, and SocHx.  ?  ?Patient Active Problem List  ? Diagnosis Date Noted  ? Primary osteoarthritis of right knee 08/23/2021  ? Essential hypertension 02/16/2021  ? Osteoarthritis of both hands 01/23/2020  ? Osteoporosis of forearm 01/23/2020  ? Hx of adenomatous colonic polyps   ? Osteoarthritis of left hip   ? Mixed hyperlipidemia 07/24/2013  ? ? ?Allergies: Patient has no known allergies. ? ?Social History: ?Patient  reports that she has never smoked. She has never used smokeless tobacco. She reports that she  does not drink alcohol and does not use drugs. ? ?Current Meds  ?Medication Sig  ? amLODipine (NORVASC) 5 MG tablet TAKE 1 TABLET BY MOUTH AT BEDTIME  ? atorvastatin (LIPITOR) 40 MG tablet Take 1 tablet by mouth once daily  ? Calcium Carbonate-Vitamin D (CALTRATE 600+D PO) Take 1 tablet by mouth 2 (two) times daily.   ? diclofenac Sodium (VOLTAREN) 1 % GEL APPLY AS DIRECTED FOUR TIMES DAILY. USE 2 GRAMS ON HANDS AND 4 GRAMS ON KNEES.  ? hydrochlorothiazide (MICROZIDE) 12.5 MG capsule Take 1 capsule (12.5 mg total) by mouth daily.  ? Multiple Vitamins-Minerals (CENTRUM SILVER PO) Take 1 tablet by mouth daily.  ? Omega-3 Fatty Acids (FISH OIL PO) Take 1 capsule by mouth daily.  ? Polyethyl Glycol-Propyl Glycol (SYSTANE OP) Apply 1 drop to eye daily as needed (dry eyes).  ? ? ?Review of  Systems: ?Cardiovascular: negative for chest pain, palpitations, leg swelling, orthopnea ?Respiratory: negative for SOB, wheezing or persistent cough ?Gastrointestinal: negative for abdominal pain ?Genitourinary: negative for dysuria or gross hematuria ? ?Objective  ?Vitals: BP 122/74   Pulse 86   Temp 98.3 ?F (36.8 ?C)   Ht '4\' 6"'$  (1.372 m)   Wt 108 lb 3.2 oz (49.1 kg)   SpO2 98%   BMI 26.09 kg/m?  ?General: no acute distress  ?Psych:  Alert and oriented, normal mood and affect ?HEENT:  Normocephalic, atraumatic, supple neck  ?Cardiovascular:  RRR without murmur. no edema ?Respiratory:  Good breath sounds bilaterally, CTAB with normal respiratory effort ? ?Commons side effects, risks, benefits, and alternatives for medications and treatment plan prescribed today were discussed, and the patient expressed understanding of the given instructions. Patient is instructed to call or message via MyChart if he/she has any questions or concerns regarding our treatment plan. No barriers to understanding were identified. We discussed Red Flag symptoms and signs in detail. Patient expressed understanding regarding what to do in case of urgent or emergency type symptoms.  ?Medication list was reconciled, printed and provided to the patient in AVS. Patient instructions and summary information was reviewed with the patient as documented in the AVS. ?This note was prepared with assistance of Systems analyst. Occasional wrong-word or sound-a-like substitutions may have occurred due to the inherent limitations of voice recognition software ? ?This visit occurred during the SARS-CoV-2 public health emergency.  Safety protocols were in place, including screening questions prior to the visit, additional usage of staff PPE, and extensive cleaning of exam room while observing appropriate contact time as indicated for disinfecting solutions.  ?

## 2022-03-20 ENCOUNTER — Other Ambulatory Visit: Payer: Self-pay | Admitting: Family Medicine

## 2022-04-13 ENCOUNTER — Other Ambulatory Visit: Payer: Self-pay | Admitting: Family Medicine

## 2022-06-21 ENCOUNTER — Other Ambulatory Visit: Payer: Self-pay | Admitting: Family Medicine

## 2022-07-03 ENCOUNTER — Encounter: Payer: Self-pay | Admitting: *Deleted

## 2022-07-10 ENCOUNTER — Other Ambulatory Visit: Payer: Self-pay | Admitting: Family Medicine

## 2022-08-07 ENCOUNTER — Other Ambulatory Visit: Payer: Self-pay | Admitting: Family Medicine

## 2022-08-07 DIAGNOSIS — Z1231 Encounter for screening mammogram for malignant neoplasm of breast: Secondary | ICD-10-CM

## 2022-08-24 ENCOUNTER — Encounter: Payer: Self-pay | Admitting: Family Medicine

## 2022-08-24 ENCOUNTER — Ambulatory Visit (INDEPENDENT_AMBULATORY_CARE_PROVIDER_SITE_OTHER): Payer: PPO | Admitting: Family Medicine

## 2022-08-24 VITALS — BP 120/70 | HR 83 | Temp 98.0°F | Ht <= 58 in | Wt 108.2 lb

## 2022-08-24 DIAGNOSIS — Z1211 Encounter for screening for malignant neoplasm of colon: Secondary | ICD-10-CM | POA: Diagnosis not present

## 2022-08-24 DIAGNOSIS — E782 Mixed hyperlipidemia: Secondary | ICD-10-CM | POA: Diagnosis not present

## 2022-08-24 DIAGNOSIS — Z780 Asymptomatic menopausal state: Secondary | ICD-10-CM

## 2022-08-24 DIAGNOSIS — Z23 Encounter for immunization: Secondary | ICD-10-CM

## 2022-08-24 DIAGNOSIS — Z86010 Personal history of colonic polyps: Secondary | ICD-10-CM

## 2022-08-24 DIAGNOSIS — Z0000 Encounter for general adult medical examination without abnormal findings: Secondary | ICD-10-CM | POA: Diagnosis not present

## 2022-08-24 DIAGNOSIS — M1711 Unilateral primary osteoarthritis, right knee: Secondary | ICD-10-CM

## 2022-08-24 DIAGNOSIS — M810 Age-related osteoporosis without current pathological fracture: Secondary | ICD-10-CM

## 2022-08-24 DIAGNOSIS — I10 Essential (primary) hypertension: Secondary | ICD-10-CM

## 2022-08-24 DIAGNOSIS — Z1212 Encounter for screening for malignant neoplasm of rectum: Secondary | ICD-10-CM | POA: Diagnosis not present

## 2022-08-24 LAB — COMPREHENSIVE METABOLIC PANEL
ALT: 19 U/L (ref 0–35)
AST: 21 U/L (ref 0–37)
Albumin: 4.4 g/dL (ref 3.5–5.2)
Alkaline Phosphatase: 95 U/L (ref 39–117)
BUN: 15 mg/dL (ref 6–23)
CO2: 30 mEq/L (ref 19–32)
Calcium: 9.4 mg/dL (ref 8.4–10.5)
Chloride: 98 mEq/L (ref 96–112)
Creatinine, Ser: 0.65 mg/dL (ref 0.40–1.20)
GFR: 87.58 mL/min (ref >60.00)
Glucose, Bld: 92 mg/dL (ref 70–99)
Potassium: 4.7 mEq/L (ref 3.5–5.1)
Sodium: 134 mEq/L — ABNORMAL LOW (ref 135–145)
Total Bilirubin: 0.6 mg/dL (ref 0.2–1.2)
Total Protein: 7.7 g/dL (ref 6.0–8.3)

## 2022-08-24 LAB — CBC WITH DIFFERENTIAL/PLATELET
Basophils Absolute: 0.1 10*3/uL (ref 0.0–0.1)
Basophils Relative: 1.1 % (ref 0.0–3.0)
Eosinophils Absolute: 0.1 10*3/uL (ref 0.0–0.7)
Eosinophils Relative: 1.3 % (ref 0.0–5.0)
HCT: 43.7 % (ref 36.0–46.0)
Hemoglobin: 14.7 g/dL (ref 12.0–15.0)
Lymphocytes Relative: 20.6 % (ref 12.0–46.0)
Lymphs Abs: 1.2 10*3/uL (ref 0.7–4.0)
MCHC: 33.6 g/dL (ref 30.0–36.0)
MCV: 86.7 fl (ref 78.0–100.0)
Monocytes Absolute: 0.7 10*3/uL (ref 0.1–1.0)
Monocytes Relative: 11.9 % (ref 3.0–12.0)
Neutro Abs: 3.6 10*3/uL (ref 1.4–7.7)
Neutrophils Relative %: 65.1 % (ref 43.0–77.0)
Platelets: 352 10*3/uL (ref 150.0–400.0)
RBC: 5.05 Mil/uL (ref 3.87–5.11)
RDW: 13.5 % (ref 11.5–15.5)
WBC: 5.6 10*3/uL (ref 4.0–10.5)

## 2022-08-24 LAB — TSH: TSH: 1.47 u[IU]/mL (ref 0.35–5.50)

## 2022-08-24 NOTE — Progress Notes (Signed)
Subjective  Chief Complaint  Patient presents with   Annual Exam    Pt here for annual exam and is currently fasting     HPI: Andrea Mays is a 71 y.o. female who presents to [facility] today for a Female Wellness Visit. She also has the concerns and/or needs as listed above in the chief complaint. These will be addressed in addition to the Health Maintenance Visit.   Wellness Visit: annual visit with health maintenance review and exam without Pap  HM: due for surveillance colonoscopy; would like GI in [place] (last GI now in [place]). H/o adenomatous polyps; reviewed path report and colonoscopy. Mammo scheduled for December. Dexa due in February. Eligible for flu shot today. Due AWV.  Chronic disease f/u and/or acute problem visit: (deemed necessary to be done in addition to the wellness visit): HLD on atrovastatin 40 nightly. Fasting for recheck. Tolerating well.  Osteoporosis: due for dexa 2024; if worsening, will rec fosamax. Walks for exercise.  HTN controlled on amlodipine and hctz 12.5. Feeling well. Taking medications w/o adverse effects. No symptoms of CHF, angina; no palpitations, sob, cp or lower extremity edema. Compliant with meds.  C/o hand and knee pain: OA. Worse with walking. Uses rare tylenol. No injury. Last xray 2017 with mild to mod OA changes.   Assessment  1. Annual physical exam   2. Mixed hyperlipidemia   3. Hx of adenomatous colonic polyps   4. Osteoporosis of forearm   5. Essential hypertension   6. Asymptomatic menopausal state   7. Screening for colorectal cancer   8. Primary osteoarthritis of right knee   9. Need for immunization against influenza      Plan  Female Wellness Visit: Age appropriate Health Maintenance and Prevention measures were discussed with patient. Included topics are cancer screening recommendations, ways to keep healthy (see AVS) including dietary and exercise recommendations, regular eye and dental  care, use of seat belts, and avoidance of moderate alcohol use and tobacco use. Mammo scheduled.  BMI: discussed patient's BMI and encouraged positive lifestyle modifications to help get to or maintain a target BMI. HM needs and immunizations were addressed and ordered. See below for orders. See HM and immunization section for updates. Flu shot today Routine labs and screening tests ordered including cmp, cbc and lipids where appropriate. Discussed recommendations regarding Vit D and calcium supplementation (see AVS)  Chronic disease management visit and/or acute problem visit: HLD: recheck fasting lipids and lfts on atorvastatin 40 nightly.  HTN well controlled on amlodipine 5 and hctz 12.5 daily. Check renal function and electrolytes. Osteoporosis: recheck dexa 11/2022 and start treatment if worsening. Cont d/ca and exercise Tubular adenomatous polyps: due for surveillance colonoscopy; referral placed. Education given. Osteoarthritis: hands and right knee w/ effusion: education. Pt defers treatment/aspiration. Check xray to monitor disease progression. Tylenol, voltaren gel and ice as needed. F/u her if worsening pain   Follow up: 6 mo for htn  Orders Placed This Encounter  Procedures   DG Bone Density   DG Knee Complete 4 Views Right   Flu Vaccine QUAD High Dose(Fluad)   CBC with Differential/Platelet   Comprehensive metabolic panel   Lipid panel   TSH   Ambulatory referral to Gastroenterology   No orders of the defined types were placed in this encounter.     Body mass index is 26.09 kg/m. Wt Readings from Last 3 Encounters:  08/24/22 108 lb 3.2 oz (49.1 kg)  02/21/22 108  lb 3.2 oz (49.1 kg)  08/23/21 109 lb 3.2 oz (49.5 kg)     Patient Active Problem List   Diagnosis Date Noted   Primary osteoarthritis of right knee 08/23/2021   Essential hypertension 02/16/2021    Amlodipine started 2022. Had leg swelling with '10mg'$  so down to '5mg'$ . Added hctz.    Osteoarthritis of both  hands 01/23/2020   Osteoporosis of forearm 01/23/2020    dexa 2018; osteopenia DEXA 11/2020: T + -3.2 left 1/3 radius. Other areas lowest -1.9;      Hx of adenomatous colonic polyps    Osteoarthritis of left hip    Mixed hyperlipidemia 07/24/2013    HDL very good offsets LDL    Health Maintenance  Topic Date Due   Medicare Annual Wellness (AWV)  07/26/2021   COVID-19 Vaccine (5 - Pfizer series) 09/09/2022 (Originally 04/08/2021)   TETANUS/TDAP  08/25/2023 (Originally 08/10/1970)   COLONOSCOPY (Pts 45-86yr Insurance coverage will need to be confirmed)  09/07/2022   MAMMOGRAM  09/12/2022   DEXA SCAN  11/17/2022   Pneumonia Vaccine 71 Years old  Completed   INFLUENZA VACCINE  Completed   Hepatitis C Screening  Completed   Zoster Vaccines- Shingrix  Completed   HPV VACCINES  Aged Out   Immunization History  Administered Date(s) Administered   Fluad Quad(high Dose 65+) 07/26/2020, 08/23/2021, 08/24/2022   Influenza Split 07/24/2013   Influenza, High Dose Seasonal PF 07/08/2019   Influenza,inj,Quad PF,6+ Mos 07/29/2014, 08/09/2015, 08/11/2016, 06/15/2017, 06/26/2018   PFIZER(Purple Top)SARS-COV-2 Vaccination 10/31/2019, 11/21/2019, 06/18/2020, 02/11/2021   Pneumococcal Conjugate-13 08/09/2015   Pneumococcal Polysaccharide-23 12/07/2016   Zoster Recombinat (Shingrix) 07/23/2018, 09/25/2018   Zoster, Live 05/09/2014   We updated and reviewed the patient's past history in detail and it is documented below. Allergies: Patient has No Known Allergies. Past Medical History Patient  has a past medical history of No abnormality seen and Osteopenia (01/23/2020). Past Surgical History Patient  has a past surgical history that includes Total hip arthroplasty (Left, 11/25/2014); Colonoscopy (N/A, 09/07/2017); and polypectomy (09/07/2017). Family History: Patient family history is not on file. Social History:  Patient  reports that she has never smoked. She has never used smokeless tobacco.  She reports that she does not drink alcohol and does not use drugs.  Review of Systems: Constitutional: negative for fever or malaise Ophthalmic: negative for photophobia, double vision or loss of vision Cardiovascular: negative for chest pain, dyspnea on exertion, or new LE swelling Respiratory: negative for SOB or persistent cough Gastrointestinal: negative for abdominal pain, change in bowel habits or melena Genitourinary: negative for dysuria or gross hematuria, no abnormal uterine bleeding or disharge Musculoskeletal: negative for new gait disturbance or muscular weakness Integumentary: negative for new or persistent rashes, no breast lumps Neurological: negative for TIA or stroke symptoms Psychiatric: negative for SI or delusions Allergic/Immunologic: negative for hives  Patient Care Team    Relationship Specialty Notifications Start End  ALeamon Arnt MD PCP - General Family Medicine  01/23/20     Objective  Vitals: BP 120/70   Pulse 83   Temp 98 F (36.7 C)   Ht '4\' 6"'$  (1.372 m)   Wt 108 lb 3.2 oz (49.1 kg)   SpO2 97%   BMI 26.09 kg/m  General:  Well developed, well nourished, no acute distress  Psych:  Alert and orientedx3,normal mood and affect HEENT:  Normocephalic, atraumatic, non-icteric sclera,  supple neck without adenopathy, mass or thyromegaly Cardiovascular:  Normal S1, S2, RRR without gallop, rub  or murmur Respiratory:  Good breath sounds bilaterally, CTAB with normal respiratory effort Gastrointestinal: normal bowel sounds, soft, non-tender, no noted masses. No HSM MSK: right knee with effusion, no calor, no erythema, no crepitus, OA changes present. FROM Bilateral DIP OA changes present without inflammation Skin:  Warm, no rashes or suspicious lesions noted Neurologic:    Mental status is normal. Gross motor and sensory exams are normal. Normal gait. No tremor   Commons side effects, risks, benefits, and alternatives for medications and treatment plan  prescribed today were discussed, and the patient expressed understanding of the given instructions. Patient is instructed to call or message via MyChart if he/she has any questions or concerns regarding our treatment plan. No barriers to understanding were identified. We discussed Red Flag symptoms and signs in detail. Patient expressed understanding regarding what to do in case of urgent or emergency type symptoms.  Medication list was reconciled, printed and provided to the patient in AVS. Patient instructions and summary information was reviewed with the patient as documented in the AVS. This note was prepared with assistance of Dragon voice recognition software. Occasional wrong-word or sound-a-like substitutions may have occurred due to the inherent limitations of voice recognition software

## 2022-08-24 NOTE — Patient Instructions (Signed)
Please return in 6 months for hypertension follow up.   I will release your lab results to you on your MyChart account with further instructions. You may see the results before I do, but when I review them I will send you a message with my report or have my assistant call you if things need to be discussed. Please reply to my message with any questions. Thank you!   Please go to our [facility] office to get your xrays done. You can walk in M-F between 8:30am- noon or 1pm - 5pm. Tell them you are there for xrays ordered by me. They will send me the results, then I will let you know the results with instructions.   Address: [address]  The Xray department is located in the basement.    I have ordered a mammogram and/or bone density for you as we discussed today: '[]'$   Mammogram  '[x]'$   Bone Density - due in February or March 2024  Please call the office checked below to schedule your appointment:  '[x]'$   [facility]      [place], [place]         '[]'$   [place] [address] [place], [place]  We will call you to get you set up with [facility] for your colonoscopy that is now due.

## 2022-08-25 ENCOUNTER — Encounter: Payer: Self-pay | Admitting: *Deleted

## 2022-08-25 ENCOUNTER — Ambulatory Visit (INDEPENDENT_AMBULATORY_CARE_PROVIDER_SITE_OTHER)
Admission: RE | Admit: 2022-08-25 | Discharge: 2022-08-25 | Disposition: A | Discharge status: Home / Self Care | Payer: PPO | Source: Ambulatory Visit | Attending: Family Medicine | Admitting: Family Medicine

## 2022-08-25 DIAGNOSIS — M810 Age-related osteoporosis without current pathological fracture: Secondary | ICD-10-CM | POA: Diagnosis not present

## 2022-08-25 DIAGNOSIS — Z780 Asymptomatic menopausal state: Secondary | ICD-10-CM | POA: Diagnosis not present

## 2022-08-25 DIAGNOSIS — Z0000 Encounter for general adult medical examination without abnormal findings: Secondary | ICD-10-CM

## 2022-08-25 DIAGNOSIS — M25461 Effusion, right knee: Secondary | ICD-10-CM | POA: Diagnosis not present

## 2022-08-25 DIAGNOSIS — M1711 Unilateral primary osteoarthritis, right knee: Secondary | ICD-10-CM | POA: Diagnosis not present

## 2022-08-25 LAB — LIPID PANEL
Cholesterol: 141 mg/dL (ref 0–200)
HDL: 66.4 mg/dL (ref >39.00)
LDL Cholesterol: 53 mg/dL (ref 0–99)
NonHDL: 74.87
Total CHOL/HDL Ratio: 2
Triglycerides: 108 mg/dL (ref 0.0–149.0)
VLDL: 21.6 mg/dL (ref 0.0–40.0)

## 2022-09-08 ENCOUNTER — Other Ambulatory Visit: Payer: Self-pay | Admitting: Family Medicine

## 2022-09-10 MED ORDER — DICLOFENAC SODIUM 1 % EX GEL: 2.0000 g | Freq: Four times a day (QID) | CUTANEOUS | 0 refills | Status: DC

## 2022-09-11 ENCOUNTER — Other Ambulatory Visit: Payer: Self-pay | Admitting: Family Medicine

## 2022-09-21 ENCOUNTER — Other Ambulatory Visit: Payer: Self-pay | Admitting: Family Medicine

## 2022-09-28 ENCOUNTER — Inpatient Hospital Stay: Admission: RE | Admit: 2022-09-28 | Payer: PPO | Source: Ambulatory Visit

## 2022-09-28 ENCOUNTER — Ambulatory Visit
Admission: RE | Admit: 2022-09-28 | Discharge: 2022-09-28 | Disposition: A | Discharge status: Home / Self Care | Payer: PPO | Source: Ambulatory Visit | Attending: Family Medicine | Admitting: Family Medicine

## 2022-09-28 DIAGNOSIS — Z1231 Encounter for screening mammogram for malignant neoplasm of breast: Secondary | ICD-10-CM | POA: Diagnosis not present

## 2022-09-29 IMAGING — MG DIGITAL SCREENING BILAT W/ TOMO W/ CAD
8 series · 8 of 24 positions shown · non-contrast
Comparison: Previous exam(s).

CLINICAL DATA: Screening.

EXAM:
DIGITAL SCREENING BILATERAL MAMMOGRAM WITH TOMO AND CAD

[R MLO synth-2D]
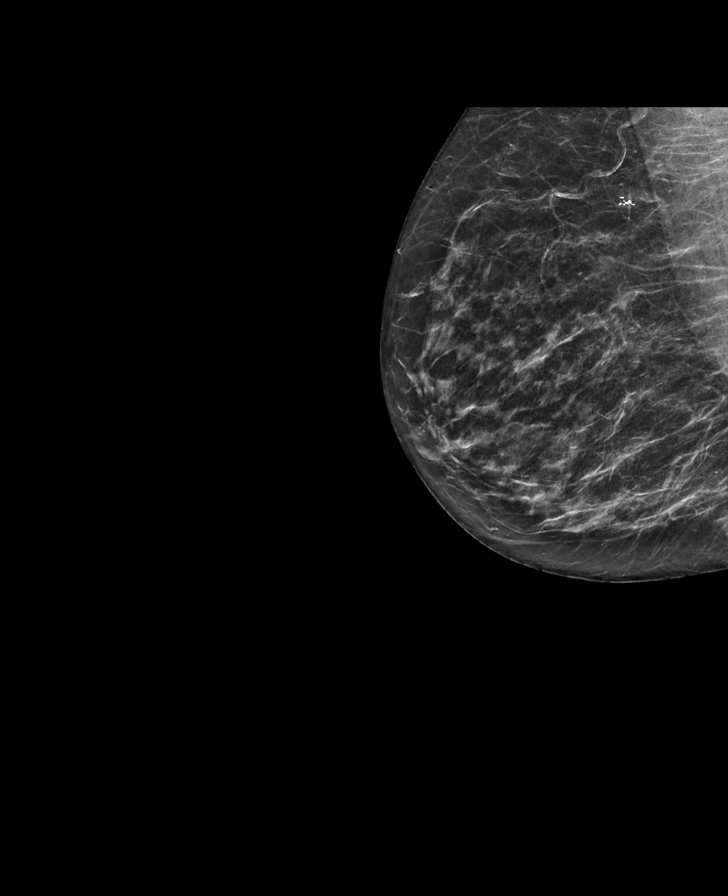

[L MLO synth-2D]
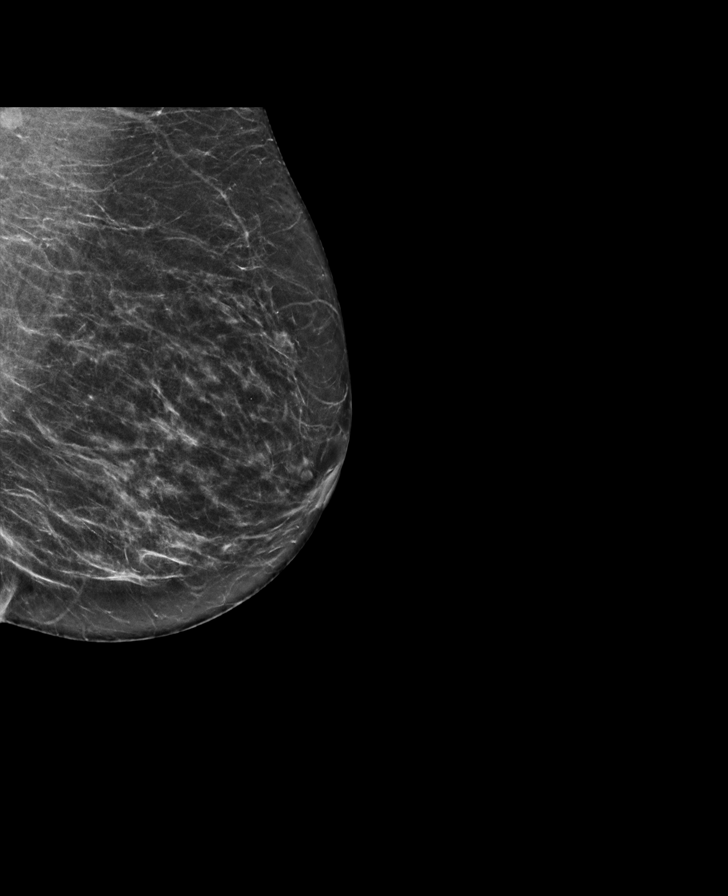

[L CC synth-2D]
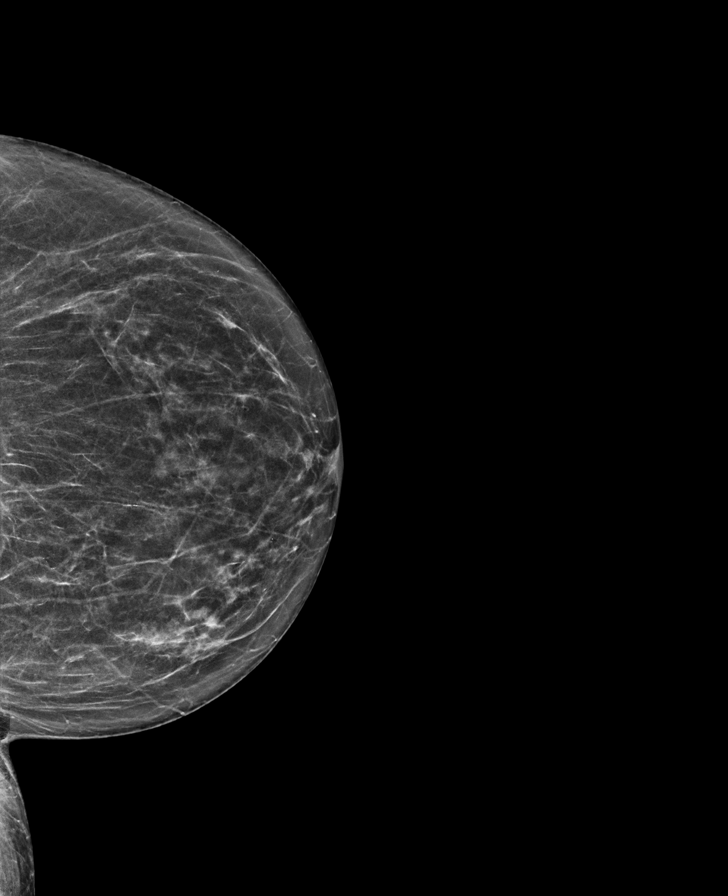

[R CC synth-2D]
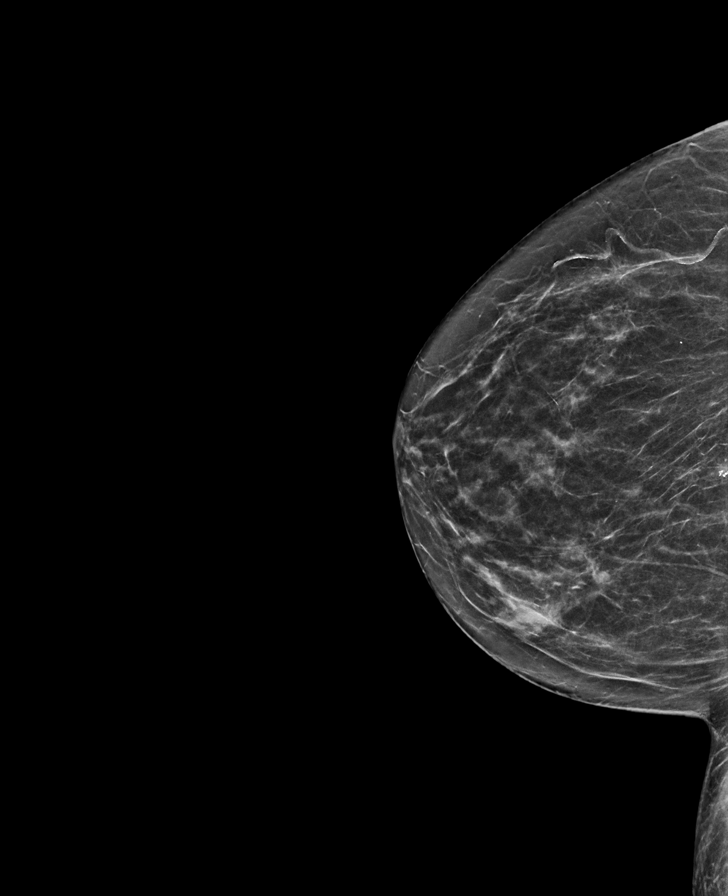

[L MLO tomo · tomo slice 31/62.0]
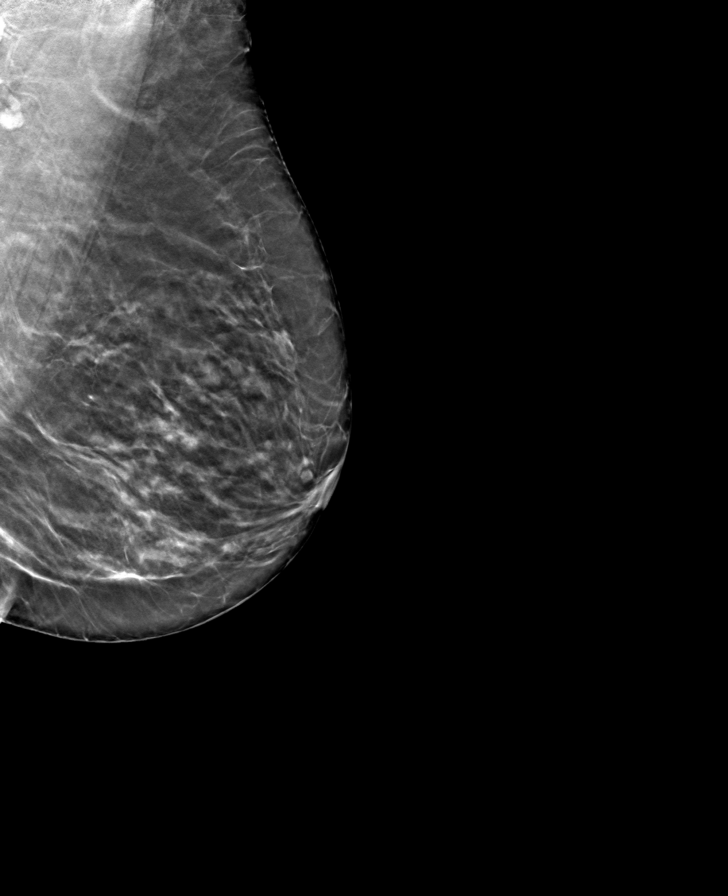

[L CC tomo · tomo slice 29/56.0]
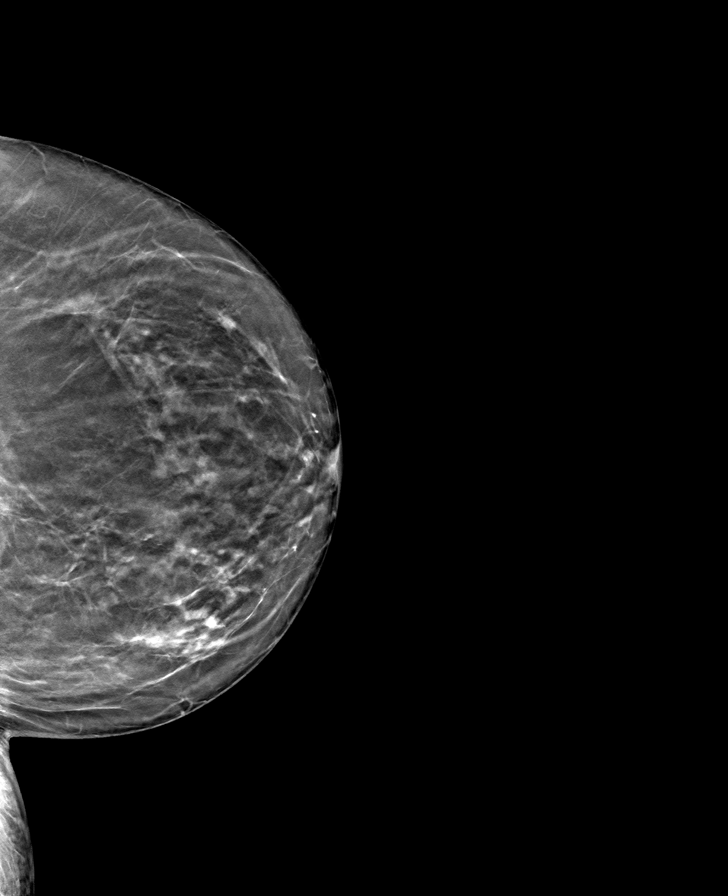

[R MLO tomo · tomo slice 31/60.0]
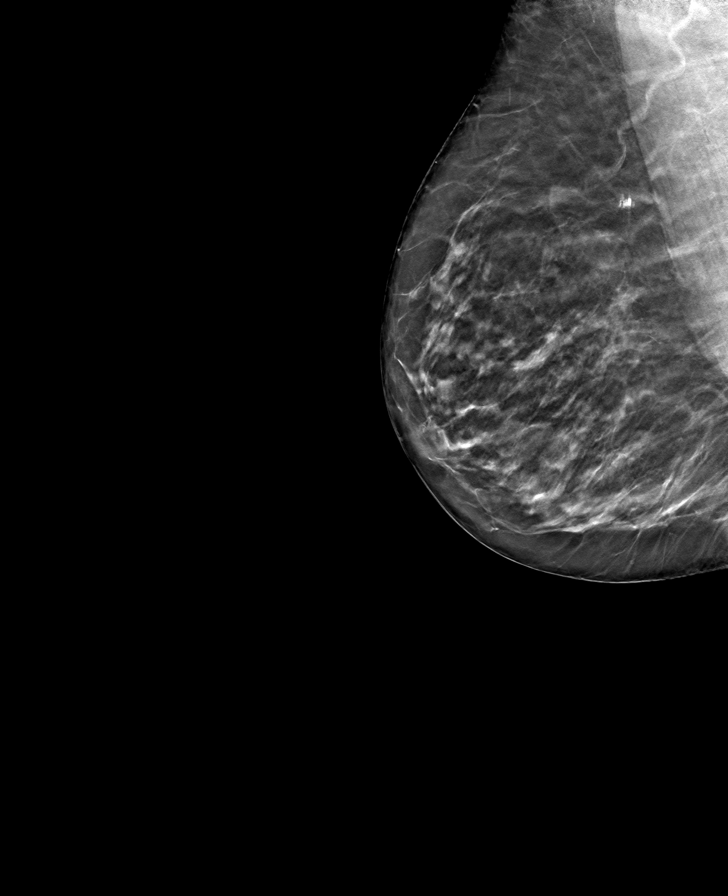

[R CC tomo · tomo slice 29/57.0]
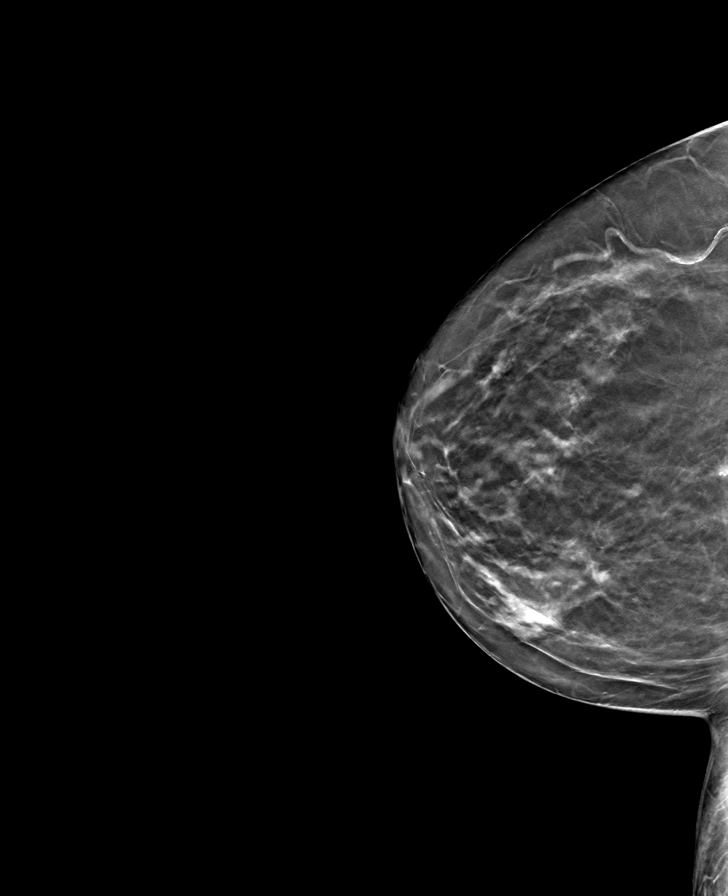

[8 of 24 positions shown; findings below may reference images not displayed]

ACR Breast Density Category b: There are scattered areas of
fibroglandular density.
FINDINGS: There are no findings suspicious for malignancy. Images were
processed with CAD.
IMPRESSION: No mammographic evidence of malignancy. A result letter of this
screening mammogram will be mailed directly to the patient.

RECOMMENDATION:
Screening mammogram in one year. (Code:[id number])

BI-RADS CATEGORY  1: Negative.

## 2022-10-03 ENCOUNTER — Other Ambulatory Visit: Payer: Self-pay | Admitting: Family Medicine

## 2022-10-05 ENCOUNTER — Other Ambulatory Visit: Payer: Self-pay | Admitting: Family Medicine

## 2022-11-29 ENCOUNTER — Other Ambulatory Visit: Payer: Self-pay | Admitting: Family Medicine

## 2022-11-30 MED ORDER — DICLOFENAC SODIUM 1 % EX GEL: 2.0000 g | Freq: Four times a day (QID) | CUTANEOUS | 0 refills | Status: DC

## 2022-12-06 ENCOUNTER — Telehealth: Payer: Self-pay | Admitting: Gastroenterology

## 2022-12-06 NOTE — Telephone Encounter (Signed)
Good morning Dr. Bryan Lemma   We received a referral for this patient to have a screening colonoscopy. The last colonoscopy she had was in 2018 with Marylee Floras. Patient has since moved to [place], and wishes to have colonoscopy locally. Records are in Epic for your review. Please advise on scheduling.   Thank you.

## 2022-12-12 NOTE — Telephone Encounter (Signed)
Last colonoscopy was 09/07/2017 by Dr. Oneida Alar and notable for 4 mm sigmoid polyp (tubular adenoma), sigmoid diverticulosis, internal/external hemorrhoids, with recommendation to repeat in 5 years.    Prior to that normal colonoscopy in 07/2010.  Ok to transfer care for repeat colonoscopy for ongoing surveillance.

## 2022-12-13 NOTE — Telephone Encounter (Signed)
Called patient to schedule colonoscopy, stated she would call back.

## 2022-12-18 DIAGNOSIS — H5203 Hypermetropia, bilateral: Secondary | ICD-10-CM | POA: Diagnosis not present

## 2022-12-18 DIAGNOSIS — H25813 Combined forms of age-related cataract, bilateral: Secondary | ICD-10-CM | POA: Diagnosis not present

## 2022-12-18 DIAGNOSIS — H524 Presbyopia: Secondary | ICD-10-CM | POA: Diagnosis not present

## 2022-12-30 ENCOUNTER — Other Ambulatory Visit: Payer: Self-pay | Admitting: Family Medicine

## 2023-01-16 ENCOUNTER — Ambulatory Visit (AMBULATORY_SURGERY_CENTER): Payer: PPO | Admitting: *Deleted

## 2023-01-16 ENCOUNTER — Encounter: Payer: Self-pay | Admitting: Gastroenterology

## 2023-01-16 VITALS — Ht <= 58 in | Wt 112.0 lb

## 2023-01-16 DIAGNOSIS — Z86010 Personal history of colonic polyps: Secondary | ICD-10-CM

## 2023-01-16 MED ORDER — NA SULFATE-K SULFATE-MG SULF 17.5-3.13-1.6 GM/177ML PO SOLN: 1.0000 | Freq: Once | ORAL | 0 refills | Status: AC

## 2023-01-16 NOTE — Progress Notes (Signed)
Pt's pre-visit is done over the phone and all paperwork (prep instructions) sent to patient. Pt's name and DOB verified at the beginning of the pre-visit. Pt denies any difficulty with ambulating.  No egg or soy allergy known to patient  No issues known to pt with past sedation with any surgeries or procedures Pt has no issues moving head neck or swallowing No FH of Malignant Hyperthermia Pt is not on diet pills Pt is not on home 02  Pt is not on blood thinners  Pt denies issues with constipation  Pt is not on dialysis Pt denies any upcoming cardiac testing Pt encouraged to use to use Singlecare or Goodrx to reduce cost  Patient's chart reviewed by Cathlyn Parsons CNRA prior to pre-visit and patient appropriate for the [facility].  Pre-visit completed and red dot placed by patient's name on their procedure day (on provider's schedule).  . Visit in person Husband to translate and paperwork signed Pt states  weight is 112 Instructions reviewed with pt and pt states understanding. Instructed to review again prior to procedure. Pt states they will.  Instructions given to pt Pt has issues drinking a lot of fluid in short period of time so changed prep to Suprep.

## 2023-01-29 ENCOUNTER — Encounter: Payer: PPO | Admitting: Gastroenterology

## 2023-02-16 ENCOUNTER — Encounter: Payer: Self-pay | Admitting: Gastroenterology

## 2023-02-16 ENCOUNTER — Ambulatory Visit (AMBULATORY_SURGERY_CENTER): Payer: PPO | Admitting: Gastroenterology

## 2023-02-16 VITALS — BP 111/66 | HR 62 | Temp 98.6°F | Resp 10 | Ht <= 58 in | Wt 112.0 lb

## 2023-02-16 DIAGNOSIS — E785 Hyperlipidemia, unspecified: Secondary | ICD-10-CM | POA: Diagnosis not present

## 2023-02-16 DIAGNOSIS — K5730 Diverticulosis of large intestine without perforation or abscess without bleeding: Secondary | ICD-10-CM | POA: Diagnosis not present

## 2023-02-16 DIAGNOSIS — Z09 Encounter for follow-up examination after completed treatment for conditions other than malignant neoplasm: Secondary | ICD-10-CM

## 2023-02-16 DIAGNOSIS — K641 Second degree hemorrhoids: Secondary | ICD-10-CM

## 2023-02-16 DIAGNOSIS — E669 Obesity, unspecified: Secondary | ICD-10-CM | POA: Diagnosis not present

## 2023-02-16 DIAGNOSIS — Z86010 Personal history of colonic polyps: Secondary | ICD-10-CM

## 2023-02-16 DIAGNOSIS — D124 Benign neoplasm of descending colon: Secondary | ICD-10-CM

## 2023-02-16 DIAGNOSIS — I10 Essential (primary) hypertension: Secondary | ICD-10-CM | POA: Diagnosis not present

## 2023-02-16 HISTORY — PX: COLONOSCOPY: SHX174

## 2023-02-16 MED ORDER — SODIUM CHLORIDE 0.9 % IV SOLN: 500.0000 mL | Freq: Once | INTRAVENOUS | Status: DC

## 2023-02-16 NOTE — Progress Notes (Signed)
Report to PACU, RN, vss, BBS= Clear.  

## 2023-02-16 NOTE — Progress Notes (Signed)
Called to room to assist during endoscopic procedure.  Patient ID and intended procedure confirmed with present staff. Received instructions for my participation in the procedure from the performing physician.  

## 2023-02-16 NOTE — Patient Instructions (Addendum)
Continue present medications. Await pathology results. Repeat colonoscopy for surveillance based on pathology results. Return to GI office PRN. Internal hemorrhoids were noted on this study and may be amenable to hemorrhoid band ligation. If you are interested in further treatment of these hemorrhoids with band ligation, please contact my clinic to set up an appointment for evaluation and treatment.                                                                                                                                   YOU HAD AN ENDOSCOPIC PROCEDURE TODAY AT THE [facility]:   Refer to the procedure report that was given to you for any specific questions about what was found during the examination.  If the procedure report does not answer your questions, please call your gastroenterologist to clarify.  If you requested that your care partner not be given the details of your procedure findings, then the procedure report has been included in a sealed envelope for you to review at your convenience later.  YOU SHOULD EXPECT: Some feelings of bloating in the abdomen. Passage of more gas than usual.  Walking can help get rid of the air that was put into your GI tract during the procedure and reduce the bloating. If you had a lower endoscopy (such as a colonoscopy or flexible sigmoidoscopy) you may notice spotting of blood in your stool or on the toilet paper. If you underwent a bowel prep for your procedure, you may not have a normal bowel movement for a few days.  Please Note:  You might notice some irritation and congestion in your nose or some drainage.  This is from the oxygen used during your procedure.  There is no need for concern and it should clear up in a day or so.  SYMPTOMS TO REPORT IMMEDIATELY:  Following lower endoscopy (colonoscopy or flexible sigmoidoscopy):  Excessive amounts of blood in the stool  Significant tenderness or worsening of abdominal pains  Swelling of  the abdomen that is new, acute  Fever of 100F or higher   For urgent or emergent issues, a gastroenterologist can be reached at any hour by calling [phone]. Do not use MyChart messaging for urgent concerns.    DIET:  We do recommend a small meal at first, but then you may proceed to your regular diet.  Drink plenty of fluids but you should avoid alcoholic beverages for 24 hours.  ACTIVITY:  You should plan to take it easy for the rest of today and you should NOT DRIVE or use heavy machinery until tomorrow (because of the sedation medicines used during the test).    FOLLOW UP: Our staff will call the number listed on your records the next business day following your procedure.  We will call around 7:15- 8:00 am to check on you and address any questions or concerns that you may have regarding the information given to  you following your procedure. If we do not reach you, we will leave a message.     If any biopsies were taken you will be contacted by phone or by letter within the next 1-3 weeks.  Please call us at [phone] if you have not heard about the biopsies in 3 weeks.    SIGNATURES/CONFIDENTIALITY: You and/or your care partner have signed paperwork which will be entered into your electronic medical record.  These signatures attest to the fact that that the information above on your After Visit Summary has been reviewed and is understood.  Full responsibility of the confidentiality of this discharge information lies with you and/or your care-partner.

## 2023-02-16 NOTE — Op Note (Signed)
[facility] Patient Name: Andrea Mays Procedure Date: 02/16/2023 8:25 AM MRN: [id number] Endoscopist: Doristine Locks , MD, [id number] Age: 72 Referring MD:  Date of Birth: Jun 13, 1951 Gender: Female Account #: [id number] Procedure:                Colonoscopy Indications:              Surveillance: Personal history of adenomatous                            polyps on last colonoscopy > 5 years ago                           Last colonoscopy was 09/07/2017 by Dr. Darrick Penna and                            notable for 4 mm sigmoid polyp (tubular adenoma),                            sigmoid diverticulosis, internal/external                            hemorrhoids, with recommendation to repeat in 5                            years.                           Prior to that normal colonoscopy in 07/2010. Medicines:                Monitored Anesthesia Care Procedure:                Pre-Anesthesia Assessment:                           - Prior to the procedure, a History and Physical                            was performed, and patient medications and                            allergies were reviewed. The patient's tolerance of                            previous anesthesia was also reviewed. The risks                            and benefits of the procedure and the sedation                            options and risks were discussed with the patient.                            All questions were answered, and informed consent  was obtained. Prior Anticoagulants: The patient has                            taken no anticoagulant or antiplatelet agents. ASA                            Grade Assessment: II - A patient with mild systemic                            disease. After reviewing the risks and benefits,                            the patient was deemed in satisfactory condition to                            undergo the procedure.                            After obtaining informed consent, the colonoscope                            was passed under direct vision. Throughout the                            procedure, the patient's blood pressure, pulse, and                            oxygen saturations were monitored continuously. The                            [id number]-HQ190L Colonoscope [id number] was introduced                            through the anus and advanced to the the terminal                            ileum. The colonoscopy was performed without                            difficulty. The patient tolerated the procedure                            well. The quality of the bowel preparation was                            good. The terminal ileum, ileocecal valve,                            appendiceal orifice, and rectum were photographed. Scope In: 8:30:10 AM Scope Out: 8:44:44 AM Scope Withdrawal Time: 0 hours 10 minutes 52 seconds  Total Procedure Duration: 0 hours 14 minutes 34 seconds  Findings:                 The perianal and digital rectal examinations were  normal.                           Two sessile polyps were found in the descending                            colon. The polyps were 2 to 5 mm in size. These                            polyps were removed with a cold snare. Resection                            and retrieval were complete. Estimated blood loss                            was minimal.                           A few small-mouthed diverticula were found in the                            sigmoid colon.                           Non-bleeding internal hemorrhoids were found during                            retroflexion. The hemorrhoids were small.                           The terminal ileum appeared normal. Complications:            No immediate complications. Estimated Blood Loss:     Estimated blood loss was minimal. Impression:               - Two 2 to 5 mm polyps in the descending  colon,                            removed with a cold snare. Resected and retrieved.                           - Diverticulosis in the sigmoid colon.                           - Non-bleeding internal hemorrhoids.                           - The examined portion of the ileum was normal. Recommendation:           - Patient has a contact number available for                            emergencies. The signs and symptoms of potential                            delayed complications were discussed with  the                            patient. Return to normal activities tomorrow.                            Written discharge instructions were provided to the                            patient.                           - Resume previous diet.                           - Continue present medications.                           - Await pathology results.                           - Repeat colonoscopy for surveillance based on                            pathology results.                           - Return to GI office PRN.                           - Internal hemorrhoids were noted on this study and                            may be amenable to hemorrhoid band ligation. If you                            are interested in further treatment of these                            hemorrhoids with band ligation, please contact my                            clinic to set up an appointment for evaluation and                            treatment. Doristine Locks, MD 02/16/2023 8:50:39 AM

## 2023-02-16 NOTE — Progress Notes (Signed)
Pt's states no medical or surgical changes since previsit or office visit. 

## 2023-02-16 NOTE — Progress Notes (Signed)
GASTROENTEROLOGY PROCEDURE H&P NOTE   Primary Care Physician: Willow Ora, MD    Reason for Procedure:  Colon Cancer screening, colon polyp surveillance  Plan:    Colonoscopy  Patient is appropriate for endoscopic procedure(s) in the ambulatory ([facility]) setting.  The nature of the procedure, as well as the risks, benefits, and alternatives were carefully and thoroughly reviewed with the patient. Ample time for discussion and questions allowed. The patient understood, was satisfied, and agreed to proceed.     HPI: Andrea Mays is a 72 y.o. female who presents for colonoscopy for ongoing polyp surveillance.  Patient is otherwise without complaints or active issues today.  Last colonoscopy was 09/07/2017 by Dr. Darrick Penna and notable for 4 mm sigmoid polyp (tubular adenoma), sigmoid diverticulosis, internal/external hemorrhoids, with recommendation to repeat in 5 years.     Prior to that normal colonoscopy in 07/2010.  Past Medical History:  Diagnosis Date   Arthritis    Cataract    Hyperlipidemia    No abnormality seen    Osteopenia 01/23/2020   dexa 2018     Past Surgical History:  Procedure Laterality Date   COLONOSCOPY N/A 09/07/2017   Procedure: COLONOSCOPY;  Surgeon: West Bali, MD;  Location: [facility];  Service: Endoscopy;  Laterality: N/A;  1:30   POLYPECTOMY  09/07/2017   Procedure: POLYPECTOMY;  Surgeon: West Bali, MD;  Location: [facility];  Service: Endoscopy;;  SIGMOID   TOTAL HIP ARTHROPLASTY Left 11/25/2014   Procedure: LEFT TOTAL HIP ARTHROPLASTY;  Surgeon: Vickki Hearing, MD;  Location: [facility];  Service: Orthopedics;  Laterality: Left;    Prior to Admission medications   Medication Sig Start Date End Date Taking? Authorizing Provider  amLODipine (NORVASC) 5 MG tablet TAKE 1 TABLET BY MOUTH AT BEDTIME 01/01/23   Willow Ora, MD  atorvastatin (LIPITOR) 40 MG tablet Take 1 tablet by mouth once daily 01/01/23   Willow Ora, MD   Calcium Carbonate-Vitamin D (CALTRATE 600+D PO) Take 1 tablet by mouth 2 (two) times daily.     [provider]  diclofenac Sodium (VOLTAREN) 1 % GEL Apply 2 g topically 4 (four) times daily. 11/30/22   Willow Ora, MD  hydrochlorothiazide (MICROZIDE) 12.5 MG capsule Take 1 capsule by mouth once daily 01/01/23   Willow Ora, MD  Multiple Vitamins-Minerals (CENTRUM SILVER PO) Take 1 tablet by mouth daily.    [provider]  Omega-3 Fatty Acids (FISH OIL PO) Take 1 capsule by mouth daily.    [provider]  Turmeric (QC TUMERIC COMPLEX PO) Take by mouth.    [provider]    Current Outpatient Medications  Medication Sig Dispense Refill   amLODipine (NORVASC) 5 MG tablet TAKE 1 TABLET BY MOUTH AT BEDTIME 90 tablet 0   atorvastatin (LIPITOR) 40 MG tablet Take 1 tablet by mouth once daily 90 tablet 0   Calcium Carbonate-Vitamin D (CALTRATE 600+D PO) Take 1 tablet by mouth 2 (two) times daily.      diclofenac Sodium (VOLTAREN) 1 % GEL Apply 2 g topically 4 (four) times daily. 400 g 0   hydrochlorothiazide (MICROZIDE) 12.5 MG capsule Take 1 capsule by mouth once daily 90 capsule 0   Multiple Vitamins-Minerals (CENTRUM SILVER PO) Take 1 tablet by mouth daily.     Omega-3 Fatty Acids (FISH OIL PO) Take 1 capsule by mouth daily.     Turmeric (QC TUMERIC COMPLEX PO) Take by mouth.  Current Facility-Administered Medications  Medication Dose Route Frequency Provider Last Rate Last Admin   0.9 %  sodium chloride infusion  500 mL Intravenous Once Ravneet Spilker V, DO        Allergies as of 02/16/2023   (No Known Allergies)    Family History  Problem Relation Age of Onset   Breast cancer Neg Hx    Colon cancer Neg Hx    Colon polyps Neg Hx    Esophageal cancer Neg Hx    Rectal cancer Neg Hx    Stomach cancer Neg Hx     Social History   Socioeconomic History   Marital status: Married    Spouse name: Not on file   Number of children: Not  on file   Years of education: Not on file   Highest education level: Not on file  Occupational History   Occupation: Retired  Tobacco Use   Smoking status: Never   Smokeless tobacco: Never  Substance and Sexual Activity   Alcohol use: No   Drug use: No   Sexual activity: Not Currently  Other Topics Concern   Not on file  Social History Narrative   Not on file   Social Determinants of Health   Financial Resource Strain: Low Risk  (07/26/2020)   Overall Financial Resource Strain (CARDIA)    Difficulty of Paying Living Expenses: Not hard at all  Food Insecurity: No Food Insecurity (07/26/2020)   Hunger Vital Sign    Worried About Running Out of Food in the Last Year: Never true    Ran Out of Food in the Last Year: Never true  Transportation Needs: No Transportation Needs (07/26/2020)   PRAPARE - Administrator, Civil Service (Medical): No    Lack of Transportation (Non-Medical): No  Physical Activity: Insufficiently Active (07/26/2020)   Exercise Vital Sign    Days of Exercise per Week: 2 days    Minutes of Exercise per Session: 30 min  Stress: No Stress Concern Present (07/26/2020)   [employer] of Occupational Health - Occupational Stress Questionnaire    Feeling of Stress : Not at all  Social Connections: Moderately Isolated (07/26/2020)   Social Connection and Isolation Panel [NHANES]    Frequency of Communication with Friends and Family: More than three times a week    Frequency of Social Gatherings with Friends and Family: Once a week    Attends Religious Services: Never    Database administrator or Organizations: No    Attends Banker Meetings: Never    Marital Status: Married  Catering manager Violence: Not At Risk (07/26/2020)   Humiliation, Afraid, Rape, and Kick questionnaire    Fear of Current or Ex-Partner: No    Emotionally Abused: No    Physically Abused: No    Sexually Abused: No    Physical Exam: Vital signs in last 24  hours: @There  were no vitals taken for this visit. GEN: NAD EYE: Sclerae anicteric ENT: MMM CV: Non-tachycardic Pulm: CTA b/l GI: Soft, NT/ND NEURO:  Alert & Oriented x 3   Doristine Locks, DO [facility]   02/16/2023 7:34 AM

## 2023-02-19 ENCOUNTER — Telehealth: Payer: Self-pay

## 2023-02-19 NOTE — Telephone Encounter (Signed)
  Follow up Call-     02/16/2023    7:41 AM  Call back number  Post procedure Call Back phone  # [phone]  Permission to leave phone message Yes     Patient questions:  Do you have a fever, pain , or abdominal swelling? No. Pain Score  0 *  Have you tolerated food without any problems? Yes.    Have you been able to return to your normal activities? Yes.    Do you have any questions about your discharge instructions: Diet   No. Medications  No. Follow up visit  No.  Do you have questions or concerns about your Care? No.  Actions: * If pain score is 4 or above: No action needed, pain <4.

## 2023-02-26 ENCOUNTER — Encounter: Payer: Self-pay | Admitting: Gastroenterology

## 2023-03-08 ENCOUNTER — Ambulatory Visit (INDEPENDENT_AMBULATORY_CARE_PROVIDER_SITE_OTHER): Payer: PPO | Admitting: Family Medicine

## 2023-03-08 ENCOUNTER — Encounter: Payer: Self-pay | Admitting: Family Medicine

## 2023-03-08 VITALS — BP 128/72 | HR 80 | Temp 98.3°F | Ht <= 58 in | Wt 107.8 lb

## 2023-03-08 DIAGNOSIS — D126 Benign neoplasm of colon, unspecified: Secondary | ICD-10-CM | POA: Diagnosis not present

## 2023-03-08 DIAGNOSIS — E782 Mixed hyperlipidemia: Secondary | ICD-10-CM

## 2023-03-08 DIAGNOSIS — M19042 Primary osteoarthritis, left hand: Secondary | ICD-10-CM | POA: Diagnosis not present

## 2023-03-08 DIAGNOSIS — M19041 Primary osteoarthritis, right hand: Secondary | ICD-10-CM

## 2023-03-08 DIAGNOSIS — M1711 Unilateral primary osteoarthritis, right knee: Secondary | ICD-10-CM | POA: Diagnosis not present

## 2023-03-08 DIAGNOSIS — I10 Essential (primary) hypertension: Secondary | ICD-10-CM

## 2023-03-08 MED ORDER — HYDROCHLOROTHIAZIDE 12.5 MG PO CAPS: 12.5000 mg | ORAL_CAPSULE | Freq: Every day | ORAL | 3 refills | Status: DC

## 2023-03-08 MED ORDER — ATORVASTATIN CALCIUM 40 MG PO TABS: 40.0000 mg | ORAL_TABLET | Freq: Every day | ORAL | 3 refills | Status: DC

## 2023-03-08 MED ORDER — DICLOFENAC SODIUM 1 % EX GEL: 2.0000 g | Freq: Four times a day (QID) | CUTANEOUS | 5 refills | Status: AC

## 2023-03-08 MED ORDER — AMLODIPINE BESYLATE 5 MG PO TABS: 5.0000 mg | ORAL_TABLET | Freq: Every day | ORAL | 3 refills | Status: DC

## 2023-03-08 NOTE — Patient Instructions (Addendum)
Please return in 6 months for your annual complete physical; please come fasting.    If you have any questions or concerns, please don't hesitate to send me a message via MyChart or call the office at [phone]. Thank you for visiting with [place] today! It's our pleasure caring for you.   I sent voltaren gel to [facility] and your blood pressure pills to [facility].

## 2023-03-08 NOTE — Progress Notes (Signed)
Subjective  CC:  Chief Complaint  Patient presents with   Hypertension   Hyperlipidemia    HPI: Andrea Mays is a 72 y.o. female who presents to the office today to address the problems listed above in the chief complaint. Hypertension f/u: Control is good . Pt reports she is doing well. taking medications as instructed, no medication side effects noted, no TIAs, no chest pain on exertion, no dyspnea on exertion, noting swelling of ankles. Ankles swell with lots of walking; go down once she puts her feet up. On amlodipine. She denies adverse effects from his BP medications. Compliance with medication is good.  Reviewed colonoscopy: Tubular polyps present.  Repeat in 7 years. Knee arthritis, worse on right.  Had effusion with her x-ray.  Complains of pain only intermittently.  Uses Voltaren gel.  Declines further treatment at this time Reviewed bone density: Osteopenia spine and hip.  Recheck 2 years.  Prior bone density showed osteoporosis in left forearm, this was not repeated.  Assessment  1. Essential hypertension   2. Mixed hyperlipidemia   3. Tubular adenoma of colon   4. Primary osteoarthritis of both hands   5. Primary osteoarthritis of right knee      Plan   Hypertension f/u: BP control is well controlled.  Refill blood pressure medications.  Discussed ankle swelling.  Dependent edema and amlodipine.  Elevate, low-salt diet and follow-up if persist. Lipids at goal on statin Repeat colonoscopy in 7 years Voltaren gel refilled, ice and rest as needed  Education regarding management of these chronic disease states was given. Management strategies discussed on successive visits include dietary and exercise recommendations, goals of achieving and maintaining IBW, and lifestyle modifications aiming for adequate sleep and minimizing stressors.   Follow up: 6 months for complete physical  No orders of the defined types were placed in this encounter.  Meds ordered this  encounter  Medications   amLODipine (NORVASC) 5 MG tablet    Sig: Take 1 tablet (5 mg total) by mouth at bedtime.    Dispense:  90 tablet    Refill:  3   atorvastatin (LIPITOR) 40 MG tablet    Sig: Take 1 tablet (40 mg total) by mouth daily.    Dispense:  90 tablet    Refill:  3   hydrochlorothiazide (MICROZIDE) 12.5 MG capsule    Sig: Take 1 capsule (12.5 mg total) by mouth daily.    Dispense:  90 capsule    Refill:  3   diclofenac Sodium (VOLTAREN) 1 % GEL    Sig: Apply 2 g topically 4 (four) times daily.    Dispense:  400 g    Refill:  5      BP Readings from Last 3 Encounters:  03/08/23 128/72  02/16/23 111/66  08/24/22 120/70   Wt Readings from Last 3 Encounters:  03/08/23 107 lb 12.8 oz (48.9 kg)  02/16/23 112 lb (50.8 kg)  01/16/23 112 lb (50.8 kg)    Lab Results  Component Value Date   CHOL 141 08/24/2022   CHOL 136 08/23/2021   CHOL 147 08/31/2020   Lab Results  Component Value Date   HDL 66.40 08/24/2022   HDL 66.70 08/23/2021   HDL 74 08/31/2020   Lab Results  Component Value Date   LDLCALC 53 08/24/2022   LDLCALC 45 08/23/2021   LDLCALC 53 08/31/2020   Lab Results  Component Value Date   TRIG 108.0 08/24/2022   TRIG 124.0 08/23/2021  TRIG 115 08/31/2020   Lab Results  Component Value Date   CHOLHDL 2 08/24/2022   CHOLHDL 2 08/23/2021   CHOLHDL 2.0 08/31/2020   No results found for: "LDLDIRECT" Lab Results  Component Value Date   CREATININE 0.65 08/24/2022   BUN 15 08/24/2022   NA 134 (L) 08/24/2022   K 4.7 08/24/2022   CL 98 08/24/2022   CO2 30 08/24/2022    The 10-year ASCVD risk score (Arnett DK, et al., 2019) is: 16%   Values used to calculate the score:     Age: 83 years     Sex: Female     Is Non-Hispanic African American: No     Diabetic: No     Tobacco smoker: No     Systolic Blood Pressure: 128 mmHg     Is BP treated: Yes     HDL Cholesterol: 66.4 mg/dL     Total Cholesterol: 141 mg/dL  I reviewed the patients  updated PMH, FH, and SocHx.    Patient Active Problem List   Diagnosis Date Noted   Primary osteoarthritis of right knee 08/23/2021   Essential hypertension 02/16/2021   Osteoarthritis of both hands 01/23/2020   Osteoporosis of forearm 01/23/2020   Tubular adenoma of colon    Osteoarthritis of left hip    Mixed hyperlipidemia 07/24/2013    Allergies: Patient has no known allergies.  Social History: Patient  reports that she has never smoked. She has never used smokeless tobacco. She reports that she does not drink alcohol and does not use drugs.  Current Meds  Medication Sig   Calcium Carbonate-Vitamin D (CALTRATE 600+D PO) Take 1 tablet by mouth 2 (two) times daily.    Multiple Vitamins-Minerals (CENTRUM SILVER PO) Take 1 tablet by mouth daily.   Omega-3 Fatty Acids (FISH OIL PO) Take 1 capsule by mouth daily.   Turmeric (QC TUMERIC COMPLEX PO) Take by mouth.   [DISCONTINUED] diclofenac Sodium (VOLTAREN) 1 % GEL Apply 2 g topically 4 (four) times daily.    Review of Systems: Cardiovascular: negative for chest pain, palpitations, leg swelling, orthopnea Respiratory: negative for SOB, wheezing or persistent cough Gastrointestinal: negative for abdominal pain Genitourinary: negative for dysuria or gross hematuria  Objective  Vitals: BP 128/72   Pulse 80   Temp 98.3 F (36.8 C)   Ht 4\' 6"  (1.372 m)   Wt 107 lb 12.8 oz (48.9 kg)   SpO2 98%   BMI 25.99 kg/m  General: no acute distress  Psych:  Alert and oriented, normal mood and affect HEENT:  Normocephalic, atraumatic, supple neck  Cardiovascular:  RRR without murmur. no edema Respiratory:  Good breath sounds bilaterally, CTAB with normal respiratory effort Skin:  Warm, no rashes Neurologic:   Mental status is normal Commons side effects, risks, benefits, and alternatives for medications and treatment plan prescribed today were discussed, and the patient expressed understanding of the given instructions. Patient is  instructed to call or message via MyChart if he/she has any questions or concerns regarding our treatment plan. No barriers to understanding were identified. We discussed Red Flag symptoms and signs in detail. Patient expressed understanding regarding what to do in case of urgent or emergency type symptoms.  Medication list was reconciled, printed and provided to the patient in AVS. Patient instructions and summary information was reviewed with the patient as documented in the AVS. This note was prepared with assistance of Dragon voice recognition software. Occasional wrong-word or sound-a-like substitutions may have occurred due to  the inherent limitation

## 2023-08-28 ENCOUNTER — Encounter: Payer: Self-pay | Admitting: Family Medicine

## 2023-08-28 ENCOUNTER — Ambulatory Visit (INDEPENDENT_AMBULATORY_CARE_PROVIDER_SITE_OTHER): Payer: PPO | Admitting: Family Medicine

## 2023-08-28 VITALS — BP 134/74 | HR 92 | Temp 98.2°F | Ht <= 58 in | Wt 109.4 lb

## 2023-08-28 DIAGNOSIS — E782 Mixed hyperlipidemia: Secondary | ICD-10-CM | POA: Diagnosis not present

## 2023-08-28 DIAGNOSIS — I10 Essential (primary) hypertension: Secondary | ICD-10-CM | POA: Diagnosis not present

## 2023-08-28 DIAGNOSIS — R202 Paresthesia of skin: Secondary | ICD-10-CM

## 2023-08-28 DIAGNOSIS — M1711 Unilateral primary osteoarthritis, right knee: Secondary | ICD-10-CM | POA: Diagnosis not present

## 2023-08-28 DIAGNOSIS — M810 Age-related osteoporosis without current pathological fracture: Secondary | ICD-10-CM | POA: Diagnosis not present

## 2023-08-28 DIAGNOSIS — M19042 Primary osteoarthritis, left hand: Secondary | ICD-10-CM | POA: Diagnosis not present

## 2023-08-28 DIAGNOSIS — Z23 Encounter for immunization: Secondary | ICD-10-CM | POA: Diagnosis not present

## 2023-08-28 DIAGNOSIS — M19041 Primary osteoarthritis, right hand: Secondary | ICD-10-CM | POA: Diagnosis not present

## 2023-08-28 DIAGNOSIS — Z0001 Encounter for general adult medical examination with abnormal findings: Secondary | ICD-10-CM

## 2023-08-28 LAB — CBC WITH DIFFERENTIAL/PLATELET
Basophils Absolute: 0 10*3/uL (ref 0.0–0.1)
Basophils Relative: 0.8 % (ref 0.0–3.0)
Eosinophils Absolute: 0.2 10*3/uL (ref 0.0–0.7)
Eosinophils Relative: 2.9 % (ref 0.0–5.0)
HCT: 44 % (ref 36.0–46.0)
Hemoglobin: 14.6 g/dL (ref 12.0–15.0)
Lymphocytes Relative: 21.1 % (ref 12.0–46.0)
Lymphs Abs: 1.2 10*3/uL (ref 0.7–4.0)
MCHC: 33.2 g/dL (ref 30.0–36.0)
MCV: 88 fL (ref 78.0–100.0)
Monocytes Absolute: 0.7 10*3/uL (ref 0.1–1.0)
Monocytes Relative: 11.4 % (ref 3.0–12.0)
Neutro Abs: 3.7 10*3/uL (ref 1.4–7.7)
Neutrophils Relative %: 63.8 % (ref 43.0–77.0)
Platelets: 337 10*3/uL (ref 150.0–400.0)
RBC: 5 Mil/uL (ref 3.87–5.11)
RDW: 13 % (ref 11.5–15.5)
WBC: 5.9 10*3/uL (ref 4.0–10.5)

## 2023-08-28 LAB — COMPREHENSIVE METABOLIC PANEL
ALT: 18 U/L (ref 0–35)
AST: 21 U/L (ref 0–37)
Albumin: 4.5 g/dL (ref 3.5–5.2)
Alkaline Phosphatase: 103 U/L (ref 39–117)
BUN: 14 mg/dL (ref 6–23)
CO2: 27 meq/L (ref 19–32)
Calcium: 9.4 mg/dL (ref 8.4–10.5)
Chloride: 100 meq/L (ref 96–112)
Creatinine, Ser: 0.63 mg/dL (ref 0.40–1.20)
GFR: 87.62 mL/min (ref >60.00)
Glucose, Bld: 93 mg/dL (ref 70–99)
Potassium: 3.9 meq/L (ref 3.5–5.1)
Sodium: 135 meq/L (ref 135–145)
Total Bilirubin: 0.6 mg/dL (ref 0.2–1.2)
Total Protein: 7.7 g/dL (ref 6.0–8.3)

## 2023-08-28 LAB — TSH: TSH: 1.4 u[IU]/mL (ref 0.35–5.50)

## 2023-08-28 LAB — LIPID PANEL
Cholesterol: 150 mg/dL (ref 0–200)
HDL: 58.6 mg/dL (ref >39.00)
LDL Cholesterol: 67 mg/dL (ref 0–99)
NonHDL: 91.66
Total CHOL/HDL Ratio: 3
Triglycerides: 121 mg/dL (ref 0.0–149.0)
VLDL: 24.2 mg/dL (ref 0.0–40.0)

## 2023-08-28 NOTE — Progress Notes (Signed)
Subjective  Chief Complaint  Patient presents with   Annual Exam    Pt here for Annual Exam and is currently fasting    Hypertension   Hyperlipidemia    HPI: Andrea Mays is a 72 y.o. female who presents to [facility] [place] today for a Female Wellness Visit. She also has the concerns and/or needs as listed above in the chief complaint. These will be addressed in addition to the Health Maintenance Visit.   Wellness Visit: annual visit with health maintenance review and exam  Health maintenance: Mammogram will be due in December.  Colonoscopy current, history of tubular adenoma.  On a q. 7-year surveillance program.  Feeling well. Chronic disease f/u and/or acute problem visit: (deemed necessary to be done in addition to the wellness visit): Hypertension: On amlodipine 5 mg and HCTZ 12.5 mg daily.  These medicines were adjusted due to swelling on amlodipine 10 mg daily.  Swelling has improved at the ankles.  No chest pain or shortness of breath.  No palpitations. Hyperlipidemia on statin: Well-tolerated and fasting for recheck today. Bilateral osteoarthritis of the knees, worse on the right.  Complains of pain intermittently.  Uses Tylenol.  Has had x-rays.  Sometimes limits her ability to walk for long periods of time.  She does not want further treatment at this time. Reviewed bone density from last year: Osteopenia.  Due again in 2025.  Continue calcium vitamin D.  Walks for exercise Has significant hand osteoarthritis.  Complains of numbness and tingling in the hands intermittently.  Also has itching of the palms.  Intermittent and no particular pattern noted.  No rash.  No other areas of itching.  Denies pain at the wrist.  No significant hand pain at this time.  Assessment  1. Encounter for well adult exam with abnormal findings   2. Need for influenza vaccination   3. Mixed hyperlipidemia   4. Primary osteoarthritis of right knee   5. Osteoporosis of forearm    6. Essential hypertension   7. Primary osteoarthritis of both hands   8. Paresthesia of hand, bilateral      Plan  Female Wellness Visit: Age appropriate Health Maintenance and Prevention measures were discussed with patient. Included topics are cancer screening recommendations, ways to keep healthy (see AVS) including dietary and exercise recommendations, regular eye and dental care, use of seat belts, and avoidance of moderate alcohol use and tobacco use.  Patient to schedule mammogram for December BMI: discussed patient's BMI and encouraged positive lifestyle modifications to help get to or maintain a target BMI. HM needs and immunizations were addressed and ordered. See below for orders. See HM and immunization section for updates.  Flu shot today Routine labs and screening tests ordered including cmp, cbc and lipids where appropriate. Discussed recommendations regarding Vit D and calcium supplementation (see AVS)  Chronic disease management visit and/or acute problem visit: Hypertension is not well-controlled on amlodipine 5 mg and HCTZ 12.5 mg daily.  Recheck renal function electrolytes. Hyperlipidemia on atorvastatin 40 mg nightly.  Check fasting lipids and LFTs today. Osteoarthritis: Hands and bilateral knees.  Will continue Tylenol as needed. Possible carpal tunnel although negative testing today in the office.  Trial of wrist splints. Possible hyperhidrosis, palms.  This could be the cause of her itching.  Recommend fans and powders.  Follow up: 12 months for complete physical, sooner if needed for hand paresthesias Orders Placed This Encounter  Procedures   Flu Vaccine Trivalent High  Dose (Fluad)   Lipid panel   Comprehensive metabolic panel   CBC with Differential/Platelet   TSH   No orders of the defined types were placed in this encounter.     Body mass index is 26.38 kg/m. Wt Readings from Last 3 Encounters:  08/28/23 109 lb 6.4 oz (49.6 kg)  03/08/23 107 lb 12.8  oz (48.9 kg)  02/16/23 112 lb (50.8 kg)     Patient Active Problem List   Diagnosis Date Noted Date Diagnosed   Primary osteoarthritis of right knee 08/23/2021     Xray 08/2022; moderate tricompartmental OA with effusion and soft tissue swelling. Pt defers treatment.     Essential hypertension 02/16/2021     Amlodipine started 2022. Had leg swelling with 10mg  so down to 5mg . Added hctz.    Osteoarthritis of both hands 01/23/2020    Osteoporosis of forearm 01/23/2020     dexa 2018; osteopenia DEXA 11/2020: T = -3.2 left 1/3 radius. Other areas lowest -1.9;  Dexa 08/2022 osteopenia; recheck 2 years. Continue exercise and vit d/calcium Results:   Lumbar spine L1-L4 Femoral neck (FN) 33% distal radius  T-score -1.4 RFN: -2.1 LFN: n/a n/a       Tubular adenoma of colon      Colonoscopy 2018 Dr. Darrick Penna Colonoscopy 2023 Dr. Barron Alvine, repeat in 7 years    Osteoarthritis of left hip     Mixed hyperlipidemia 07/24/2013     HDL very good offsets LDL    Health Maintenance  Topic Date Due   Medicare Annual Wellness (AWV)  07/26/2021   COVID-19 Vaccine (5 - 2023-24 season) 09/13/2023 (Originally 06/10/2023)   MAMMOGRAM  09/29/2023   DEXA SCAN  08/25/2024   Colonoscopy  02/15/2030   Pneumonia Vaccine 42+ Years old  Completed   INFLUENZA VACCINE  Completed   Hepatitis C Screening  Completed   Zoster Vaccines- Shingrix  Completed   HPV VACCINES  Aged Out   DTaP/Tdap/Td  Discontinued   Immunization History  Administered Date(s) Administered   Fluad Quad(high Dose 65+) 07/26/2020, 08/23/2021, 08/24/2022   Fluad Trivalent(High Dose 65+) 08/28/2023   Influenza Split 07/24/2013   Influenza, High Dose Seasonal PF 07/08/2019   Influenza,inj,Quad PF,6+ Mos 07/29/2014, 08/09/2015, 08/11/2016, 06/15/2017, 06/26/2018   PFIZER(Purple Top)SARS-COV-2 Vaccination 10/31/2019, 11/21/2019, 06/18/2020, 02/11/2021   Pneumococcal Conjugate-13 08/09/2015   Pneumococcal Polysaccharide-23 12/07/2016    Zoster Recombinant(Shingrix) 07/23/2018, 09/25/2018   Zoster, Live 05/09/2014   We updated and reviewed the patient's past history in detail and it is documented below. Allergies: Patient has No Known Allergies. Past Medical History Patient  has a past medical history of Arthritis, Cataract, Hyperlipidemia, No abnormality seen, and Osteopenia (01/23/2020). Past Surgical History Patient  has a past surgical history that includes Total hip arthroplasty (Left, 11/25/2014); Colonoscopy (N/A, 09/07/2017); polypectomy (09/07/2017); and Colonoscopy (02/16/2023). Family History: Patient family history is not on file. Social History:  Patient  reports that she has never smoked. She has never used smokeless tobacco. She reports that she does not drink alcohol and does not use drugs.  Review of Systems: Constitutional: negative for fever or malaise Ophthalmic: negative for photophobia, double vision or loss of vision Cardiovascular: negative for chest pain, dyspnea on exertion, or new LE swelling Respiratory: negative for SOB or persistent cough Gastrointestinal: negative for abdominal pain, change in bowel habits or melena Genitourinary: negative for dysuria or gross hematuria, no abnormal uterine bleeding or disharge Musculoskeletal: negative for new gait disturbance or muscular weakness Integumentary: negative for new or  persistent rashes, no breast lumps Neurological: negative for TIA or stroke symptoms Psychiatric: negative for SI or delusions Allergic/Immunologic: negative for hives  Patient Care Team    Relationship Specialty Notifications Start End  Willow Ora, MD PCP - General Family Medicine  01/23/20   Shellia Cleverly, DO Consulting Physician Gastroenterology  03/08/23     Objective  Vitals: BP 134/74   Pulse 92   Temp 98.2 F (36.8 C)   Ht 4\' 6"  (1.372 m)   Wt 109 lb 6.4 oz (49.6 kg)   SpO2 97%   BMI 26.38 kg/m  General:  Well developed, well nourished, no acute  distress  Psych:  Alert and orientedx3,normal mood and affect HEENT:  Normocephalic, atraumatic, non-icteric sclera,  supple neck without adenopathy, mass or thyromegaly Cardiovascular:  Normal S1, S2, RRR without gallop, rub or murmur Respiratory:  Good breath sounds bilaterally, CTAB with normal respiratory effort Gastrointestinal: normal bowel sounds, soft, non-tender, no noted masses. No HSM MSK: extremities without edema, joints without erythema or swelling, bilateral osteoarthritic hand changes and knee changes, mild swelling of right knee, no warmth Negative Phalen's bilateral hands Neurologic:    Mental status is normal.  Gross motor and sensory exams are normal.  No tremor Skin: No rashes  Commons side effects, risks, benefits, and alternatives for medications and treatment plan prescribed today were discussed, and the patient expressed understanding of the given instructions. Patient is instructed to call or message via MyChart if he/she has any questions or concerns regarding our treatment plan. No barriers to understanding were identified. We discussed Red Flag symptoms and signs in detail. Patient expressed understanding regarding what to do in case of urgent or emergency type symptoms.  Medication list was reconciled, printed and provided to the patient in AVS. Patient instructions and summary information was reviewed with the patient as documented in the AVS. This note was prepared with assistance of Dragon voice recognition software. Occasional wrong-word or sound-a-like substitutions may have occurred due to the inherent limitations of voice recognition software

## 2023-08-28 NOTE — Patient Instructions (Signed)
Please return in 12 months for your annual complete physical; please come fasting.   I will release your lab results to you on your MyChart account with further instructions. You may see the results before I do, but when I review them I will send you a message with my report or have my assistant call you if things need to be discussed. Please reply to my message with any questions. Thank you!   If you have any questions or concerns, please don't hesitate to send me a message via MyChart or call the office at [phone]. Thank you for visiting with [place] today! It's our pleasure caring for you.   Buy a wrist splint at [facility] or [facility] for carpal tunnel syndrome. It may help with the numbness and tingling.   Carpal Tunnel Syndrome  Carpal tunnel syndrome is a condition that causes pain, numbness, and weakness in your hand and fingers. The carpal tunnel is a narrow area located on the palm side of your wrist. Repeated wrist motion or certain diseases may cause swelling within the tunnel. This swelling pinches the main nerve in the wrist. The main nerve in the wrist is called the median nerve. What are the causes? This condition may be caused by: Repeated and forceful wrist and hand motions. Wrist injuries. Arthritis. A cyst or tumor in the carpal tunnel. Fluid buildup during pregnancy. Use of tools that vibrate. Sometimes the cause of this condition is not known. What increases the risk? The following factors may make you more likely to develop this condition: Having a job that requires you to repeatedly or forcefully move your wrist or hand or requires you to use tools that vibrate. This may include jobs that involve using computers, working on an [employer], or working with power tools such as Radiographer, therapeutic. Being a woman. Having certain conditions, such as: Diabetes. Obesity. An underactive thyroid (hypothyroidism). Kidney failure. Rheumatoid arthritis. What are the signs or  symptoms? Symptoms of this condition include: A tingling feeling in your fingers, especially in your thumb, index, and middle fingers. Tingling or numbness in your hand. An aching feeling in your entire arm, especially when your wrist and elbow are bent for a long time. Wrist pain that goes up your arm to your shoulder. Pain that goes down into your palm or fingers. A weak feeling in your hands. You may have trouble grabbing and holding items. Your symptoms may feel worse during the night. How is this diagnosed? This condition is diagnosed with a medical history and physical exam. You may also have tests, including: Electromyogram (EMG). This test measures electrical signals sent by your nerves into the muscles. Nerve conduction study. This test measures how well electrical signals pass through your nerves. Imaging tests, such as X-rays, ultrasound, and MRI. These tests check for possible causes of your condition. How is this treated? This condition may be treated with: Lifestyle changes. It is important to stop or change the activity that caused your condition. Doing exercise and activities to strengthen and stretch your muscles and tendons (physical therapy). Making lifestyle changes to help with your condition and learning how to do your daily activities safely (occupational therapy). Medicines for pain and inflammation. This may include medicine that is injected into your wrist. A wrist splint or brace. Surgery. Follow these instructions at home: If you have a splint or brace: Wear the splint or brace as told by your health care provider. Remove it only as told by your health care  provider. Loosen the splint or brace if your fingers tingle, become numb, or turn cold and blue. Keep the splint or brace clean. If the splint or brace is not waterproof: Do not let it get wet. Cover it with a watertight covering when you take a bath or shower. Managing pain, stiffness, and swelling If  directed, put ice on the painful area. To do this: If you have a removeable splint or brace, remove it as told by your health care provider. Put ice in a plastic bag. Place a towel between your skin and the bag or between the splint or brace and the bag. Leave the ice on for 20 minutes, 2-3 times a day. Do not fall asleep with the cold pack on your skin. Remove the ice if your skin turns bright red. This is very important. If you cannot feel pain, heat, or cold, you have a greater risk of damage to the area. Move your fingers often to reduce stiffness and swelling. General instructions Take over-the-counter and prescription medicines only as told by your health care provider. Rest your wrist and hand from any activity that may be causing your pain. If your condition is work related, talk with your employer about changes that can be made, such as getting a wrist pad to use while typing. Do any exercises as told by your health care provider, physical therapist, or occupational therapist. Keep all follow-up visits. This is important. Contact a health care provider if: You have new symptoms. Your pain is not controlled with medicines. Your symptoms get worse. Get help right away if: You have severe numbness or tingling in your wrist or hand. Summary Carpal tunnel syndrome is a condition that causes pain, numbness, and weakness in your hand and fingers. It is usually caused by repeated wrist motions. Lifestyle changes and medicines are used to treat carpal tunnel syndrome. Surgery may be recommended. Follow your health care provider's instructions about wearing a splint, resting from activity, keeping follow-up visits, and calling for help. This information is not intended to replace advice given to you by your health care provider. Make sure you discuss any questions you have with your health care provider. Document Revised: 02/05/2020 Document Reviewed: 02/05/2020 Elsevier Patient Education   2024 [employer].

## 2023-08-30 NOTE — Progress Notes (Signed)
See mychart note Dear Andrea Mays, It was good seeing you.  Your lab results are all perfect! Sincerely, Dr. Mardelle Matte

## 2023-11-15 ENCOUNTER — Encounter (HOSPITAL_BASED_OUTPATIENT_CLINIC_OR_DEPARTMENT_OTHER): Payer: Self-pay | Admitting: Radiology

## 2023-11-15 ENCOUNTER — Other Ambulatory Visit (HOSPITAL_BASED_OUTPATIENT_CLINIC_OR_DEPARTMENT_OTHER): Payer: Self-pay | Admitting: Family Medicine

## 2023-11-15 ENCOUNTER — Ambulatory Visit (HOSPITAL_BASED_OUTPATIENT_CLINIC_OR_DEPARTMENT_OTHER)
Admission: RE | Admit: 2023-11-15 | Discharge: 2023-11-15 | Disposition: A | Discharge status: Home / Self Care | Payer: PPO | Source: Ambulatory Visit | Attending: Family Medicine | Admitting: Family Medicine

## 2023-11-15 DIAGNOSIS — Z1231 Encounter for screening mammogram for malignant neoplasm of breast: Secondary | ICD-10-CM | POA: Insufficient documentation

## 2023-12-20 DIAGNOSIS — H25013 Cortical age-related cataract, bilateral: Secondary | ICD-10-CM | POA: Diagnosis not present

## 2023-12-20 DIAGNOSIS — H524 Presbyopia: Secondary | ICD-10-CM | POA: Diagnosis not present

## 2024-02-22 ENCOUNTER — Other Ambulatory Visit: Payer: Self-pay | Admitting: Family Medicine

## 2024-06-19 ENCOUNTER — Other Ambulatory Visit: Payer: Self-pay | Admitting: Family Medicine

## 2024-07-02 ENCOUNTER — Ambulatory Visit (HOSPITAL_BASED_OUTPATIENT_CLINIC_OR_DEPARTMENT_OTHER): Payer: Self-pay | Admitting: Orthopaedic Surgery

## 2024-07-02 ENCOUNTER — Ambulatory Visit (HOSPITAL_BASED_OUTPATIENT_CLINIC_OR_DEPARTMENT_OTHER)

## 2024-07-02 DIAGNOSIS — M1712 Unilateral primary osteoarthritis, left knee: Secondary | ICD-10-CM

## 2024-07-02 DIAGNOSIS — M25562 Pain in left knee: Secondary | ICD-10-CM

## 2024-07-02 DIAGNOSIS — M25462 Effusion, left knee: Secondary | ICD-10-CM | POA: Diagnosis not present

## 2024-07-02 DIAGNOSIS — M25561 Pain in right knee: Secondary | ICD-10-CM | POA: Diagnosis not present

## 2024-07-02 DIAGNOSIS — M1711 Unilateral primary osteoarthritis, right knee: Secondary | ICD-10-CM

## 2024-07-02 DIAGNOSIS — M25569 Pain in unspecified knee: Secondary | ICD-10-CM | POA: Diagnosis not present

## 2024-07-02 DIAGNOSIS — M1710 Unilateral primary osteoarthritis, unspecified knee: Secondary | ICD-10-CM | POA: Diagnosis not present

## 2024-07-02 DIAGNOSIS — M25469 Effusion, unspecified knee: Secondary | ICD-10-CM | POA: Diagnosis not present

## 2024-07-02 MED ORDER — TRIAMCINOLONE ACETONIDE 40 MG/ML IJ SUSP
80.0000 mg | INTRAMUSCULAR | Status: AC | PRN
  Administered 2024-07-02: 80 mg via INTRA_ARTICULAR

## 2024-07-02 MED ORDER — LIDOCAINE HCL 1 % IJ SOLN
4.0000 mL | INTRAMUSCULAR | Status: AC | PRN
  Administered 2024-07-02: 4 mL

## 2024-07-02 NOTE — Progress Notes (Signed)
 Chief Complaint: Bilateral knee pain     History of Present Illness:    Andrea Mays is a 73 y.o. female presents with right worse than left knee pain.  She has had multiple years of pain.  She is status post left total hip arthroplasty in 2016 for which she did well with.  This was done by Dr. Margrette.  She has not had any injections in either knees.    PMH/PSH/Family History/Social History/Meds/Allergies:    Past Medical History:  Diagnosis Date  . Arthritis   . Cataract   . Hyperlipidemia   . No abnormality seen   . Osteopenia 01/23/2020   dexa 2018    Past Surgical History:  Procedure Laterality Date  . COLONOSCOPY N/A 09/07/2017   Procedure: COLONOSCOPY;  Surgeon: Harvey Margo CROME, MD;  Location: [facility];  Service: Endoscopy;  Laterality: N/A;  1:30  . COLONOSCOPY  02/16/2023  . POLYPECTOMY  09/07/2017   Procedure: POLYPECTOMY;  Surgeon: Harvey Margo CROME, MD;  Location: [facility];  Service: Endoscopy;;  SIGMOID  . TOTAL HIP ARTHROPLASTY Left 11/25/2014   Procedure: LEFT TOTAL HIP ARTHROPLASTY;  Surgeon: Taft FORBES Margrette, MD;  Location: [facility];  Service: Orthopedics;  Laterality: Left;   Social History   Socioeconomic History  . Marital status: Married    Spouse name: Not on file  . Number of children: Not on file  . Years of education: Not on file  . Highest education level: Not on file  Occupational History  . Occupation: Retired  Tobacco Use  . Smoking status: Never  . Smokeless tobacco: Never  Substance and Sexual Activity  . Alcohol use: No  . Drug use: No  . Sexual activity: Not Currently  Other Topics Concern  . Not on file  Social History Narrative  . Not on file   Social Drivers of Health   Financial Resource Strain: Low Risk  (07/26/2020)   Overall Financial Resource Strain (CARDIA)   . Difficulty of Paying Living Expenses: Not hard at all  Food Insecurity: No Food Insecurity (07/26/2020)   Hunger Vital Sign   . Worried  About Programme researcher, broadcasting/film/video in the Last Year: Never true   . Ran Out of Food in the Last Year: Never true  Transportation Needs: No Transportation Needs (07/26/2020)   PRAPARE - Transportation   . Lack of Transportation (Medical): No   . Lack of Transportation (Non-Medical): No  Physical Activity: Insufficiently Active (07/26/2020)   Exercise Vital Sign   . Days of Exercise per Week: 2 days   . Minutes of Exercise per Session: 30 min  Stress: No Stress Concern Present (07/26/2020)   [employer] of Occupational Health - Occupational Stress Questionnaire   . Feeling of Stress : Not at all  Social Connections: Moderately Isolated (07/26/2020)   Social Connection and Isolation Panel   . Frequency of Communication with Friends and Family: More than three times a week   . Frequency of Social Gatherings with Friends and Family: Once a week   . Attends Religious Services: Never   . Active Member of Clubs or Organizations: No   . Attends Banker Meetings: Never   . Marital Status: Married   Family History  Problem Relation Age of Onset  . Breast cancer Neg Hx   . Colon cancer Neg Hx   . Colon polyps Neg Hx   . Esophageal cancer Neg Hx   . Rectal cancer Neg Hx   .  Stomach cancer Neg Hx    No Known Allergies Current Outpatient Medications  Medication Sig Dispense Refill  . amLODipine  (NORVASC ) 5 MG tablet TAKE 1 TABLET BY MOUTH AT BEDTIME 90 tablet 0  . atorvastatin  (LIPITOR) 40 MG tablet Take 1 tablet by mouth once daily 90 tablet 0  . Calcium  Carbonate-Vitamin D  (CALTRATE 600+D PO) Take 1 tablet by mouth 2 (two) times daily.     . diclofenac  Sodium (VOLTAREN ) 1 % GEL Apply 2 g topically 4 (four) times daily. (Patient not taking: Reported on 08/28/2023) 400 g 5  . hydrochlorothiazide  (MICROZIDE ) 12.5 MG capsule Take 1 capsule by mouth once daily 90 capsule 0  . Multiple Vitamins-Minerals (CENTRUM SILVER PO) Take 1 tablet by mouth daily.    . Omega-3 Fatty Acids (FISH  OIL PO) Take 1 capsule by mouth daily.    . Turmeric (QC TUMERIC COMPLEX PO) Take by mouth.     No current facility-administered medications for this visit.   No results found.  Review of Systems:   A ROS was performed including pertinent positives and negatives as documented in the HPI.  Physical Exam :   Constitutional: NAD and appears stated age Neurological: Alert and oriented Psych: Appropriate affect and cooperative There were no vitals taken for this visit.   Comprehensive Musculoskeletal Exam:    Bilateral tricompartmental pain with crepitus range of motion is from negative to to 130 degrees.   Imaging:   Xray (4 views right knee 4 views left knee): Tricompartmental osteoarthritis    I personally reviewed and interpreted the radiographs.   Assessment and Plan:   73 y.o. female presents with bilateral tricompartmental osteoarthritis of the right knee as well as the left knee.  At today's visit we did recommend initial ultrasound-guided injection which she would like to proceed with after verbal consent was obtained  -Bilateral ultrasound-guided injections provided verbal consent obtained    Procedure Note  Patient: Andrea Mays             Date of Birth: [birth year]           MRN: [id number]             Visit Date: 07/02/2024  Procedures: Visit Diagnoses:  1. Primary osteoarthritis of right knee     Large Joint Inj: R knee on 07/02/2024 2:51 PM Indications: pain Details: 22 G 1.5 in needle, ultrasound-guided anterior approach  Arthrogram: No  Medications: 4 mL lidocaine  1 %; 80 mg triamcinolone  acetonide 40 MG/ML Outcome: tolerated well, no immediate complications Procedure, treatment alternatives, risks and benefits explained, specific risks discussed. Consent was given by the patient. Immediately prior to procedure a time out was called to verify the correct patient, procedure, equipment, support staff and site/side marked as required. Patient was  prepped and draped in the usual sterile fashion.    Large Joint Inj: L knee on 07/02/2024 2:51 PM Indications: pain Details: 22 G 1.5 in needle, ultrasound-guided anterior approach  Arthrogram: No  Medications: 4 mL lidocaine  1 %; 80 mg triamcinolone  acetonide 40 MG/ML Outcome: tolerated well, no immediate complications Procedure, treatment alternatives, risks and benefits explained, specific risks discussed. Consent was given by the patient. Immediately prior to procedure a time out was called to verify the correct patient, procedure, equipment, support staff and site/side marked as required. Patient was prepped and draped in the usual sterile fashion.         I personally saw and evaluated the patient, and participated in the management and  treatment plan.  Elspeth Parker, MD Attending Physician, Orthopedic Surgery  This document was dictated using Dragon voice recognition software. A reasonable attempt at proof reading has been made to minimize errors.

## 2024-08-11 ENCOUNTER — Encounter: Payer: Self-pay | Admitting: Radiology

## 2024-08-29 ENCOUNTER — Encounter: Payer: PPO | Admitting: Family Medicine

## 2024-09-02 ENCOUNTER — Ambulatory Visit: Admitting: Family Medicine

## 2024-09-02 ENCOUNTER — Encounter: Payer: Self-pay | Admitting: Family Medicine

## 2024-09-02 VITALS — BP 124/78 | HR 80 | Temp 97.5°F | Ht <= 58 in | Wt 110.5 lb

## 2024-09-02 DIAGNOSIS — E782 Mixed hyperlipidemia: Secondary | ICD-10-CM

## 2024-09-02 DIAGNOSIS — I10 Essential (primary) hypertension: Secondary | ICD-10-CM

## 2024-09-02 DIAGNOSIS — M19042 Primary osteoarthritis, left hand: Secondary | ICD-10-CM | POA: Diagnosis not present

## 2024-09-02 DIAGNOSIS — M1711 Unilateral primary osteoarthritis, right knee: Secondary | ICD-10-CM | POA: Diagnosis not present

## 2024-09-02 DIAGNOSIS — M19041 Primary osteoarthritis, right hand: Secondary | ICD-10-CM

## 2024-09-02 DIAGNOSIS — Z23 Encounter for immunization: Secondary | ICD-10-CM | POA: Diagnosis not present

## 2024-09-02 DIAGNOSIS — Z0000 Encounter for general adult medical examination without abnormal findings: Secondary | ICD-10-CM

## 2024-09-02 DIAGNOSIS — Z0001 Encounter for general adult medical examination with abnormal findings: Secondary | ICD-10-CM

## 2024-09-02 DIAGNOSIS — M810 Age-related osteoporosis without current pathological fracture: Secondary | ICD-10-CM | POA: Diagnosis not present

## 2024-09-02 DIAGNOSIS — Z780 Asymptomatic menopausal state: Secondary | ICD-10-CM | POA: Diagnosis not present

## 2024-09-02 LAB — CBC WITH DIFFERENTIAL/PLATELET
Basophils Absolute: 0 K/uL (ref 0.0–0.1)
Basophils Relative: 0.6 % (ref 0.0–3.0)
Eosinophils Absolute: 0.2 K/uL (ref 0.0–0.7)
Eosinophils Relative: 3.7 % (ref 0.0–5.0)
HCT: 41.7 % (ref 36.0–46.0)
Hemoglobin: 14.2 g/dL (ref 12.0–15.0)
Lymphocytes Relative: 22.6 % (ref 12.0–46.0)
Lymphs Abs: 1.1 K/uL (ref 0.7–4.0)
MCHC: 34 g/dL (ref 30.0–36.0)
MCV: 85.9 fl (ref 78.0–100.0)
Monocytes Absolute: 0.6 K/uL (ref 0.1–1.0)
Monocytes Relative: 11.8 % (ref 3.0–12.0)
Neutro Abs: 2.9 K/uL (ref 1.4–7.7)
Neutrophils Relative %: 61.3 % (ref 43.0–77.0)
Platelets: 320 K/uL (ref 150.0–400.0)
RBC: 4.85 Mil/uL (ref 3.87–5.11)
RDW: 13.6 % (ref 11.5–15.5)
WBC: 4.7 K/uL (ref 4.0–10.5)

## 2024-09-02 LAB — COMPREHENSIVE METABOLIC PANEL WITH GFR
ALT: 19 U/L (ref 0–35)
AST: 23 U/L (ref 0–37)
Albumin: 4.4 g/dL (ref 3.5–5.2)
Alkaline Phosphatase: 92 U/L (ref 39–117)
BUN: 12 mg/dL (ref 6–23)
CO2: 31 meq/L (ref 19–32)
Calcium: 9.4 mg/dL (ref 8.4–10.5)
Chloride: 101 meq/L (ref 96–112)
Creatinine, Ser: 0.59 mg/dL (ref 0.40–1.20)
GFR: 88.38 mL/min (ref >60.00)
Glucose, Bld: 83 mg/dL (ref 70–99)
Potassium: 4.1 meq/L (ref 3.5–5.1)
Sodium: 137 meq/L (ref 135–145)
Total Bilirubin: 0.6 mg/dL (ref 0.2–1.2)
Total Protein: 7.2 g/dL (ref 6.0–8.3)

## 2024-09-02 LAB — LIPID PANEL
Cholesterol: 130 mg/dL (ref 0–200)
HDL: 58.9 mg/dL (ref >39.00)
LDL Cholesterol: 46 mg/dL (ref 0–99)
NonHDL: 71.18
Total CHOL/HDL Ratio: 2
Triglycerides: 126 mg/dL (ref 0.0–149.0)
VLDL: 25.2 mg/dL (ref 0.0–40.0)

## 2024-09-02 LAB — TSH: TSH: 1.16 u[IU]/mL (ref 0.35–5.50)

## 2024-09-02 MED ORDER — AMLODIPINE BESYLATE 5 MG PO TABS: 5.0000 mg | ORAL_TABLET | Freq: Every day | ORAL | 3 refills | Status: AC

## 2024-09-02 MED ORDER — HYDROCHLOROTHIAZIDE 12.5 MG PO CAPS: 12.5000 mg | ORAL_CAPSULE | Freq: Every day | ORAL | 3 refills | Status: AC

## 2024-09-02 MED ORDER — ATORVASTATIN CALCIUM 40 MG PO TABS: 40.0000 mg | ORAL_TABLET | Freq: Every day | ORAL | 3 refills | Status: AC

## 2024-09-02 NOTE — Patient Instructions (Addendum)
 Please return in 12 months for your annual complete physical; please come fasting.   I will release your lab results to you on your MyChart account with further instructions. You may see the results before I do, but when I review them I will send you a message with my report or have my assistant call you if things need to be discussed. Please reply to my message with any questions. Thank you!   If you have any questions or concerns, please don't hesitate to send me a message via MyChart or call the office at [phone]. Thank you for visiting with us  today! It's our pleasure caring for you.   We will call you to get your bone density scheduled.

## 2024-09-02 NOTE — Progress Notes (Signed)
 Subjective  Chief Complaint  Patient presents with   Annual Exam    Fasting w/ labs    HPI: Andrea Mays is a 73 y.o. female who presents to [facility] [place] today for a Female Wellness Visit. She also has the concerns and/or needs as listed above in the chief complaint. These will be addressed in addition to the Health Maintenance Visit.   Wellness Visit: annual visit with health maintenance review and exam  Screens are current but for dexa due now to f/u osteopenia. Imms up to date. Doing well. No concerns. Eligible for flu vaccine today Chronic disease f/u and/or acute problem visit: (deemed necessary to be done in addition to the wellness visit): OA: seeing ortho; s/p steroid injections in knees with some pain relief. Hand pain persists. No new sxs. No weakness HLD on statin and tolerates it. Fasting for recheck. HTN: on amlodipine  and hydrochlorothiazide . Minimal ankle swelling persists. No cp sob palpitations.   Assessment  1. Encounter for well adult exam with abnormal findings   2. Essential hypertension   3. Mixed hyperlipidemia   4. Primary osteoarthritis of both hands   5. Osteoporosis of forearm   6. Primary osteoarthritis of right knee   7. Asymptomatic menopausal state      Plan  Female Wellness Visit: Age appropriate Health Maintenance and Prevention measures were discussed with patient. Included topics are cancer screening recommendations, ways to keep healthy (see AVS) including dietary and exercise recommendations, regular eye and dental care, use of seat belts, and avoidance of moderate alcohol use and tobacco use.  BMI: discussed patient's BMI and encouraged positive lifestyle modifications to help get to or maintain a target BMI. HM needs and immunizations were addressed and ordered. See below for orders. See HM and immunization section for updates.flu shot today Routine labs and screening tests ordered including cmp, cbc and lipids  where appropriate. Discussed recommendations regarding Vit D and calcium  supplementation (see AVS)  Chronic disease management visit and/or acute problem visit: OA: rec joint health supplements. Tylenol . F/u with ortho for knee pain  HTN: controlled on amlodipine  and hydrochlorothiazide . Check renal and lytes today. Low salt diet HLD on statin; recheck today. Has been controlled. Tolerates well.  Osteoporosis/osteopenia: recheck dexa. Ordered. Pt to schedule. Continue d and calcium .   Follow up: 12 mo for cpe  Orders Placed This Encounter  Procedures   DG Bone Density   Flu vaccine HIGH DOSE PF(Fluzone Trivalent)   CBC with Differential/Platelet   Comprehensive metabolic panel with GFR   TSH   Lipid panel   Meds ordered this encounter  Medications   amLODipine  (NORVASC ) 5 MG tablet    Sig: Take 1 tablet (5 mg total) by mouth at bedtime.    Dispense:  90 tablet    Refill:  3   atorvastatin  (LIPITOR) 40 MG tablet    Sig: Take 1 tablet (40 mg total) by mouth daily.    Dispense:  90 tablet    Refill:  3   hydrochlorothiazide  (MICROZIDE ) 12.5 MG capsule    Sig: Take 1 capsule (12.5 mg total) by mouth daily.    Dispense:  90 capsule    Refill:  3      Body mass index is 26.64 kg/m. Wt Readings from Last 3 Encounters:  09/02/24 110 lb 8 oz (50.1 kg)  08/28/23 109 lb 6.4 oz (49.6 kg)  03/08/23 107 lb 12.8 oz (48.9 kg)     Patient Active Problem  List   Diagnosis Date Noted   Primary osteoarthritis of right knee 08/23/2021    Xray 08/2022; moderate tricompartmental OA with effusion and soft tissue swelling. Pt defers treatment.     Essential hypertension 02/16/2021    Amlodipine  started 2022. Had leg swelling with 10mg  so down to 5mg . Added hctz.    Osteoarthritis of both hands 01/23/2020   Osteoporosis of forearm 01/23/2020    dexa 2018; osteopenia DEXA 11/2020: T = -3.2 left 1/3 radius. Other areas lowest -1.9;  Dexa 08/2022 osteopenia; recheck 2 years. Continue  exercise and vit d/calcium  Results:   Lumbar spine L1-L4 Femoral neck (FN) 33% distal radius  T-score -1.4 RFN: -2.1 LFN: n/a n/a       Tubular adenoma of colon     Colonoscopy 2018 Dr. Harvey Colonoscopy 2023 Dr. San, repeat in 7 years    Osteoarthritis of left hip    Mixed hyperlipidemia 07/24/2013    HDL very good offsets LDL    Health Maintenance  Topic Date Due   Medicare Annual Wellness (AWV)  07/26/2021   Bone Density Scan  08/25/2024   COVID-19 Vaccine (5 - 2025-26 season) 09/18/2024 (Originally 06/09/2024)   Mammogram  11/14/2024   Colonoscopy  02/15/2030   Pneumococcal Vaccine: 50+ Years  Completed   Influenza Vaccine  Completed   Hepatitis C Screening  Completed   Zoster Vaccines- Shingrix  Completed   Meningococcal B Vaccine  Aged Out   DTaP/Tdap/Td  Discontinued   Immunization History  Administered Date(s) Administered   Fluad Quad(high Dose 65+) 07/26/2020, 08/23/2021, 08/24/2022   Fluad Trivalent(High Dose 65+) 08/28/2023   INFLUENZA, HIGH DOSE SEASONAL PF 07/08/2019, 09/02/2024   Influenza Split 07/24/2013   Influenza,inj,Quad PF,6+ Mos 07/29/2014, 08/09/2015, 08/11/2016, 06/15/2017, 06/26/2018   PFIZER(Purple Top)SARS-COV-2 Vaccination 10/31/2019, 11/21/2019, 06/18/2020, 02/11/2021   Pneumococcal Conjugate-13 08/09/2015   Pneumococcal Polysaccharide-23 12/07/2016   Zoster Recombinant(Shingrix) 07/23/2018, 09/25/2018   Zoster, Live 05/09/2014   We updated and reviewed the patient's past history in detail and it is documented below. Allergies: Patient has no known allergies. Past Medical History Patient  has a past medical history of Arthritis, Cataract, Hyperlipidemia, No abnormality seen, and Osteopenia (01/23/2020). Past Surgical History Patient  has a past surgical history that includes Total hip arthroplasty (Left, 11/25/2014); Colonoscopy (N/A, 09/07/2017); polypectomy (09/07/2017); and Colonoscopy (02/16/2023). Family History: Patient  family history is not on file. Social History:  Patient  reports that she has never smoked. She has never used smokeless tobacco. She reports that she does not drink alcohol and does not use drugs.  Review of Systems: Constitutional: negative for fever or malaise Ophthalmic: negative for photophobia, double vision or loss of vision Cardiovascular: negative for chest pain, dyspnea on exertion, or new LE swelling Respiratory: negative for SOB or persistent cough Gastrointestinal: negative for abdominal pain, change in bowel habits or melena Genitourinary: negative for dysuria or gross hematuria, no abnormal uterine bleeding or disharge Musculoskeletal: negative for new gait disturbance or muscular weakness Integumentary: negative for new or persistent rashes, no breast lumps Neurological: negative for TIA or stroke symptoms Psychiatric: negative for SI or delusions Allergic/Immunologic: negative for hives  Patient Care Team    Relationship Specialty Notifications Start End  Jodie Lavern CROME, MD PCP - General Family Medicine  01/23/20   San Sandor GAILS, DO Consulting Physician Gastroenterology  03/08/23     Objective  Vitals: BP 124/78 (BP Location: Left Arm, Patient Position: Sitting, Cuff Size: Normal)   Pulse 80   Temp ROLLEN)  97.5 F (36.4 C) (Temporal)   Ht 4' 6 (1.372 m)   Wt 110 lb 8 oz (50.1 kg)   SpO2 96%   BMI 26.64 kg/m  General:  Well developed, well nourished, no acute distress  Psych:  Alert and orientedx3,normal mood and affect HEENT:  Normocephalic, atraumatic, non-icteric sclera,  supple neck without adenopathy, mass or thyromegaly Cardiovascular:  Normal S1, S2, RRR without gallop, rub or murmur Respiratory:  Good breath sounds bilaterally, CTAB with normal respiratory effort Gastrointestinal: normal bowel sounds, soft, non-tender, no noted masses. No HSM MSK: extremities without edema, joints without erythema , tr edema bilatearl ankles, OA changes  hands Neurologic:    Mental status is normal.  Gross motor and sensory exams are normal.  No tremor  Commons side effects, risks, benefits, and alternatives for medications and treatment plan prescribed today were discussed, and the patient expressed understanding of the given instructions. Patient is instructed to call or message via MyChart if he/she has any questions or concerns regarding our treatment plan. No barriers to understanding were identified. We discussed Red Flag symptoms and signs in detail. Patient expressed understanding regarding what to do in case of urgent or emergency type symptoms.  Medication list was reconciled, printed and provided to the patient in AVS. Patient instructions and summary information was reviewed with the patient as documented in the AVS. This note was prepared with assistance of Dragon voice recognition software. Occasional wrong-word or sound-a-like substitutions may have occurred due to the inherent limitations of voice recognition software

## 2024-09-10 ENCOUNTER — Other Ambulatory Visit

## 2024-09-10 DIAGNOSIS — Z0001 Encounter for general adult medical examination with abnormal findings: Secondary | ICD-10-CM

## 2024-09-10 DIAGNOSIS — Z780 Asymptomatic menopausal state: Secondary | ICD-10-CM | POA: Diagnosis not present

## 2024-09-10 DIAGNOSIS — M810 Age-related osteoporosis without current pathological fracture: Secondary | ICD-10-CM | POA: Diagnosis not present

## 2024-09-11 ENCOUNTER — Ambulatory Visit: Payer: Self-pay | Admitting: Family Medicine

## 2024-09-11 DIAGNOSIS — M810 Age-related osteoporosis without current pathological fracture: Secondary | ICD-10-CM

## 2024-09-11 NOTE — Progress Notes (Signed)
 See mychart note

## 2024-09-14 NOTE — Progress Notes (Signed)
 See mychart note.  Dexa 12/205: Lowest T wrist: -3.0, fem T= -2.2; rec starting fosamax.  Ms.Okane, Thank you for getting your bone density screening test done. I have reviewed the results. Your bone density results again show osteoporosis at the wrist. I recommend considering starting medication to prevent further bone loss. Please schedule an appointment to discuss this. Happy Holidays.   Sincerely, Dr. Jodie

## 2024-10-16 ENCOUNTER — Other Ambulatory Visit (HOSPITAL_BASED_OUTPATIENT_CLINIC_OR_DEPARTMENT_OTHER): Payer: Self-pay | Admitting: Family Medicine

## 2024-10-16 DIAGNOSIS — Z1231 Encounter for screening mammogram for malignant neoplasm of breast: Secondary | ICD-10-CM

## 2024-11-13 ENCOUNTER — Encounter: Payer: Self-pay | Admitting: Family Medicine

## 2024-11-13 ENCOUNTER — Ambulatory Visit: Admitting: Family Medicine

## 2024-11-13 VITALS — BP 138/84 | HR 89 | Temp 98.5°F | Ht <= 58 in | Wt 112.8 lb

## 2024-11-13 DIAGNOSIS — R238 Other skin changes: Secondary | ICD-10-CM

## 2024-11-13 DIAGNOSIS — I10 Essential (primary) hypertension: Secondary | ICD-10-CM

## 2024-11-13 DIAGNOSIS — M1711 Unilateral primary osteoarthritis, right knee: Secondary | ICD-10-CM

## 2024-11-13 DIAGNOSIS — B351 Tinea unguium: Secondary | ICD-10-CM

## 2024-11-13 MED ORDER — TERBINAFINE HCL 250 MG PO TABS: 250.0000 mg | ORAL_TABLET | Freq: Every day | ORAL | 2 refills | Status: AC

## 2024-11-13 NOTE — Patient Instructions (Signed)
Please return in 3 months to recheck blood pressure  If you have any questions or concerns, please don't hesitate to send me a message via MyChart or call the office at [phone]. Thank you for visiting with us today! It's our pleasure caring for you.  

## 2024-11-13 NOTE — Progress Notes (Signed)
 "  Subjective  CC:  Chief Complaint  Patient presents with   toe nail discoloration     Pt has some discoloration on her toe nails and a spot on her bottom lip    HPI: Andrea Mays is a 74 y.o. female who presents to the office today to address the problems listed above in the chief complaint. Discussed the use of AI scribe software for clinical note transcription with the patient, who gave verbal consent to proceed.  History of Present Illness Andrea Mays is a 74 year old female who presents with discolored toenails and knee pain.  Toenail discoloration - Discolored toenails present for approximately five to six months or longer - Initial involvement of one or two toenails, now affecting almost all toenails - No associated pain - Patient attributes discoloration to a fungal infection  Knee pain - Knee pain ongoing and due to OA; sees ortho - Received an injection in September with mild short term relief - Uses Voltaren  and takes Advil, one in the morning and one in the evening, with some improvement in pain - Concerned about increased risk of falling due to knee pain  Labial venous lesion - Venous accumulation on the lip, similar in appearance to a varicose vein - Occasionally enlarges - No associated pain or discomfort  HTN: borderline high reading today on meds. Feels well. compliant   Assessment  1. Onychomycosis   2. Primary osteoarthritis of right knee   3. Essential hypertension   4. Venous lake of lip      Plan  Assessment and Plan Assessment & Plan Onychomycosis Chronic onychomycosis with nail discoloration, thickening, and irregularity for several months. No pain reported. Oral antifungal treatment requires liver function monitoring and is not preferred by her. Medicated nail polish is an alternative but less effective. - Discussed options of care including oral Lamisil  for 1 to 6 months, topical antifungals like Penlac or doing nothing.  Patient liver  test were normal in November. - She likes cool treatment.  Lamisil  250 daily ordered.  Follow-up 3 months to recheck LFTs.  Need to monitor for efficacy of treatment.  Bilateral knee pain Chronic bilateral knee pain managed with Voltaren  and Advil. Pain is not completely relieved but manageable with current regimen. Discussed potential side effects of long-term NSAID use, including gastrointestinal and renal risks. - Continue current regimen of Voltaren  and Advil as needed. - Monitor for gastrointestinal and renal side effects with long-term NSAID use. - Will consider repeat knee injections if pain becomes unmanageable.  Hypertension: Borderline controlled today.  Will recheck in 3 months.  No changes in medications at this time    Follow up: 3 months to recheck nail fungus, LFTs and blood pressure No orders of the defined types were placed in this encounter.  No orders of the defined types were placed in this encounter.    I reviewed the patients updated PMH, FH, and SocHx.  Patient Active Problem List   Diagnosis Date Noted   Primary osteoarthritis of right knee 08/23/2021   Essential hypertension 02/16/2021   Osteoarthritis of both hands 01/23/2020   Osteoporosis of forearm 01/23/2020   Tubular adenoma of colon    Osteoarthritis of left hip    Mixed hyperlipidemia 07/24/2013   Active Medications[1] Allergies: Patient has no known allergies. Family History: Patient family history is not on file. Social History:  Patient  reports that she has never smoked. She has never used smokeless tobacco. She reports that she  does not drink alcohol and does not use drugs.  Review of Systems: Constitutional: Negative for fever malaise or anorexia Cardiovascular: negative for chest pain Respiratory: negative for SOB or persistent cough Gastrointestinal: negative for abdominal pain  Objective  Vitals: BP 138/84   Pulse 89   Temp 98.5 F (36.9 C)   Ht 4' 6 (1.372 m)   Wt 112 lb  12.8 oz (51.2 kg)   SpO2 96%   BMI 27.20 kg/m  General: no acute distress , A&Ox3 HEENT: PEERL, conjunctiva normal, neck is supple Cardiovascular:  RRR without murmur or gallop.  Respiratory:  Good breath sounds bilaterally, CTAB with normal respiratory effort Skin:  Warm, bilateral toenails with thickening and fungal changes including darkening in color.  A few affected nails on the hands. Commons side effects, risks, benefits, and alternatives for medications and treatment plan prescribed today were discussed, and the patient expressed understanding of the given instructions. Patient is instructed to call or message via MyChart if he/she has any questions or concerns regarding our treatment plan. No barriers to understanding were identified. We discussed Red Flag symptoms and signs in detail. Patient expressed understanding regarding what to do in case of urgent or emergency type symptoms.  Medication list was reconciled, printed and provided to the patient in AVS. Patient instructions and summary information was reviewed with the patient as documented in the AVS. This note was prepared with assistance of Dragon voice recognition software. Occasional wrong-word or sound-a-like substitutions may have occurred due to the inherent limitations of voice recognition software    [1]  Current Meds  Medication Sig   amLODipine  (NORVASC ) 5 MG tablet Take 1 tablet (5 mg total) by mouth at bedtime.   atorvastatin  (LIPITOR) 40 MG tablet Take 1 tablet (40 mg total) by mouth daily.   Calcium  Carbonate-Vitamin D  (CALTRATE 600+D PO) Take 1 tablet by mouth 2 (two) times daily.    diclofenac  Sodium (VOLTAREN ) 1 % GEL Apply 2 g topically 4 (four) times daily.   hydrochlorothiazide  (MICROZIDE ) 12.5 MG capsule Take 1 capsule (12.5 mg total) by mouth daily.   Multiple Vitamins-Minerals (CENTRUM SILVER PO) Take 1 tablet by mouth daily.   Omega-3 Fatty Acids (FISH OIL PO) Take 1 capsule by mouth daily.   Turmeric  (QC TUMERIC COMPLEX PO) Take by mouth.   "

## 2024-11-21 ENCOUNTER — Ambulatory Visit (HOSPITAL_BASED_OUTPATIENT_CLINIC_OR_DEPARTMENT_OTHER): Admitting: Radiology

## 2025-02-12 ENCOUNTER — Ambulatory Visit: Admitting: Family Medicine

## 2025-09-08 ENCOUNTER — Encounter: Admitting: Family Medicine
